# Patient Record
Sex: Female | Born: 1985 | Race: White | Hispanic: No | Marital: Single | Smoking: Current every day smoker
Health system: Southern US, Community
[De-identification: ages and names within clinical notes are randomized; demographics above are authoritative.]

## PROBLEM LIST (undated history)

## (undated) DIAGNOSIS — N926 Irregular menstruation, unspecified: Secondary | ICD-10-CM

## (undated) HISTORY — PX: NO PAST SURGERIES: SHX2092

## (undated) HISTORY — DX: Irregular menstruation, unspecified: N92.6

---

## 2020-09-06 NOTE — L&D Delivery Note (Addendum)
Vaginal Delivery Note  Spontaneous delivery of live viable female infant from the LOA position through an intact perineum. Delivery of anterior right shoulder with gentle downward guidance followed by delivery of the left posterior shoulder with gentle upward guidance. Body followed spontaneously. Infant placed on maternal chest. Nursery present and helped with neonatal resuscitation and evaluation. Cord clamped and cut after one minute. Cord blood collected. Placenta delivered spontaneously and intact with a 3 vessel cord.  Left labial  laceration repaired with 3-0 vicryl. Uterus firm and below umbilicus at the end of the delivery.  Mom and baby recovering in stable condition. Sponge and needle counts were correct at the end of the delivery.  APGARS: 1 minute:8 5 minutes: 9 Weight: pending Epidural present Laceration: Left labial  Adelene Idler MD [facility], [facility] 05/02/21 4:08 AM

## 2020-12-23 ENCOUNTER — Ambulatory Visit (INDEPENDENT_AMBULATORY_CARE_PROVIDER_SITE_OTHER): Payer: Medicaid Other | Admitting: Advanced Practice Midwife

## 2020-12-23 ENCOUNTER — Encounter: Payer: Self-pay | Admitting: Advanced Practice Midwife

## 2020-12-23 ENCOUNTER — Other Ambulatory Visit (HOSPITAL_COMMUNITY)
Admission: RE | Admit: 2020-12-23 | Discharge: 2020-12-23 | Disposition: A | Discharge status: Home / Self Care | Payer: Medicaid Other | Source: Ambulatory Visit | Attending: Advanced Practice Midwife | Admitting: Advanced Practice Midwife

## 2020-12-23 ENCOUNTER — Other Ambulatory Visit: Payer: Self-pay

## 2020-12-23 VITALS — BP 122/74 | Ht 64.0 in | Wt 155.0 lb

## 2020-12-23 DIAGNOSIS — Z3481 Encounter for supervision of other normal pregnancy, first trimester: Secondary | ICD-10-CM | POA: Diagnosis present

## 2020-12-23 DIAGNOSIS — Z369 Encounter for antenatal screening, unspecified: Secondary | ICD-10-CM

## 2020-12-23 DIAGNOSIS — Z113 Encounter for screening for infections with a predominantly sexual mode of transmission: Secondary | ICD-10-CM

## 2020-12-23 DIAGNOSIS — Z3482 Encounter for supervision of other normal pregnancy, second trimester: Secondary | ICD-10-CM

## 2020-12-23 DIAGNOSIS — Z124 Encounter for screening for malignant neoplasm of cervix: Secondary | ICD-10-CM | POA: Diagnosis present

## 2020-12-23 DIAGNOSIS — Z3490 Encounter for supervision of normal pregnancy, unspecified, unspecified trimester: Secondary | ICD-10-CM | POA: Insufficient documentation

## 2020-12-23 DIAGNOSIS — Z3A20 20 weeks gestation of pregnancy: Secondary | ICD-10-CM

## 2020-12-23 NOTE — Progress Notes (Signed)
New Obstetric Patient H&P    Chief Complaint: "Desires prenatal care"   History of Present Illness: Patient is a 35 y.o. [id number] Not Hispanic or Latino female, presents with amenorrhea and positive home pregnancy test. Patient's last menstrual period was 07/30/2020. and based on her  LMP, her EDD is Estimated Date of Delivery: 05/06/21 and her EGA is [redacted]w[redacted]d. Cycles are 5 days, regular, and occur approximately every : 1-3 months. Her last pap smear was 3 years ago and was no abnormalities.    She had a urine pregnancy test which was positive 3 week(s)  ago. Her last menstrual period was normal and lasted for  5 day(s). Since her LMP she claims she has experienced breast tenderness, fatigue, nausea, vomiting, and positive fetal movement for the past 2 weeks. She denies vaginal bleeding. Her past medical history is noncontributory. Her prior pregnancies are notable for G1 2008 FT SVD female 7#12oz, G2 2017 FT SVD female 7#1oz  Since her LMP, she admits to the use of tobacco products  Yes she is cutting down and currently smoking 10 cpd She claims she has gained weight since the start of her pregnancy and does not know her pre-pregnant weight.  There are cats in the home in the home  no  She admits close contact with children on a regular basis  yes  She has had chicken pox in the past yes She has had Tuberculosis exposures, symptoms, or previously tested positive for TB   no Current or past history of domestic violence. no  Genetic Screening/Teratology Counseling: (Includes patient, baby's father, or anyone in either family with:)   1. Patient's age >/= 68 at [facility]  no 2. Thalassemia ([place], [place], Mediterranean, or Asian background): MCV<80  no 3. Neural tube defect (meningomyelocele, spina bifida, anencephaly)  no 4. Congenital heart defect  FOB brother with heart defect  5. Down syndrome  no 6. Tay-Sachs (Jewish, [place]  no 7. Canavan's Disease  no 8. Sickle cell disease or trait  (African)  no  9. Hemophilia or other blood disorders  no  10. Muscular dystrophy  no  11. Cystic fibrosis  no  12. Huntington's Chorea  no  13. Mental retardation/autism  no 14. Other inherited genetic or chromosomal disorder  no 15. Maternal metabolic disorder (DM, PKU, etc)  no 16. Patient or FOB with a child with a birth defect not listed above no  16a. Patient or FOB with a birth defect themselves no 17. Recurrent pregnancy loss, or stillbirth  no  18. Any medications since LMP other than prenatal vitamins (include vitamins, supplements, OTC meds, drugs, alcohol)  no 19. Any other genetic/environmental exposure to discuss  no  Infection History:   1. Lives with someone with TB or TB exposed  no  2. Patient or partner has history of genital herpes  no 3. Rash or viral illness since LMP  no 4. History of STI (GC, CT, HPV, syphilis, HIV)  no 5. History of recent travel :  no  Other pertinent information:  no    Review of Systems:10 point review of systems negative unless otherwise noted in HPI  Past Medical History:  Patient Active Problem List   Diagnosis Date Noted  . Supervision of normal pregnancy 12/23/2020    Clinic [facility] Prenatal Labs  Dating  Blood type:     Genetic Screen 1 Screen:    AFP:     Quad:     NIPS: Antibody:   Anatomic [place]  Rubella:    Varicella: @VZVIGG @  GTT Early: NA               Third trimester:  RPR:     Rhogam  HBsAg:     Vaccines TDAP:                       Flu Shot: Covid: HIV:     Baby Food Plans breast                               GBS:   GC/CT:  Contraception  Pap: ~3 years ago- [facility] normal per patient report. 12/23/2020:  CBB     CS/VBAC NA Late entry to care ~21 wks  Support Person [name] Irregular periods        Past Surgical History:  Past Surgical History:  Procedure Laterality Date  . NO PAST SURGERIES      Gynecologic History: Patient's last menstrual period was 07/30/2020.  Obstetric History:  08/01/2020  Family History:  History reviewed. No pertinent family history.  Social History:  Social History   Socioeconomic History  . Marital status: Single    Spouse name: Not on file  . Number of children: Not on file  . Years of education: Not on file  . Highest education level: Not on file  Occupational History  . Not on file  Tobacco Use  . Smoking status: Current Every Day Smoker  . Smokeless tobacco: Never Used  Substance and Sexual Activity  . Alcohol use: Never  . Drug use: Never  . Sexual activity: Yes    Birth control/protection: None  Other Topics Concern  . Not on file  Social History Narrative  . Not on file   Social Determinants of Health   Financial Resource Strain: Not on file  Food Insecurity: Not on file  Transportation Needs: Not on file  Physical Activity: Not on file  Stress: Not on file  Social Connections: Not on file  Intimate Partner Violence: Not on file    Allergies:  No Known Allergies  Medications: Prior to Admission medications   Not on File    Physical Exam Vitals: Blood pressure 122/74, height 5\' 4"  (1.626 m), weight 155 lb (70.3 kg), last menstrual period 07/30/2020.  General: NAD HEENT: normocephalic, anicteric Thyroid: no enlargement, no palpable nodules Pulmonary: No increased work of breathing, CTAB Cardiovascular: RRR, distal pulses 2+ Abdomen: NABS, soft, non-tender, non-distended.  Umbilicus without lesions.  No hepatomegaly, splenomegaly or masses palpable. No evidence of hernia. FHTs 130s/140s. Fundal Height 21 cm Genitourinary:  External: Normal external female genitalia.  Normal urethral meatus, normal Bartholin's and Skene's glands.    Vagina: Normal vaginal mucosa, no evidence of prolapse.    Cervix: Grossly normal in appearance, no bleeding, no CMT  Uterus: Enlarged, mobile, normal contour.    Adnexa: ovaries non-enlarged, no adnexal masses  Rectal: deferred Extremities: no edema, erythema, or  tenderness Neurologic: Grossly intact Psychiatric: mood appropriate, affect full   The following were addressed during this visit:  Breastfeeding Education - Early initiation of breastfeeding    Comments: Keeps milk supply adequate, helps contract uterus and slow bleeding, and early milk is the perfect first food and is easy to digest.   - The importance of exclusive breastfeeding    Comments: Provides antibodies, Lower risk of breast and ovarian cancers, and type-2 diabetes,Helps your body recover, Reduced chance of SIDS.   -  Risks of giving your baby anything other than breast milk if you are breastfeeding    Comments: Make the baby less content with breastfeeds, may make my baby more susceptible to illness, and may reduce my milk supply.   - The importance of early skin-to-skin contact    Comments: Keeps baby warm and secure, helps keep baby's blood sugar up and breathing steady, easier to bond and breastfeed, and helps calm baby.  - Rooming-in on a 24-hour basis    Comments: Easier to learn baby's feeding cues, easier to bond and get to know each other, and encourages milk production.   - Feeding on demand or baby-led feeding    Comments: Helps prevent breastfeeding complications, helps bring in good milk supply, prevents under or overfeeding, and helps baby feel content and satisfied   - Frequent feeding to help assure optimal milk production    Comments: Making a full supply of milk requires frequent removal of milk from breasts, infant will eat 8-12 times in 24 hours, if separated from infant use breast massage, hand expression and/ or pumping to remove milk from breasts.   - Effective positioning and attachment    Comments: Helps my baby to get enough breast milk, helps to produce an adequate milk supply, and helps prevent nipple pain and damage   - Exclusive breastfeeding for the first 6 months    Comments: Builds a healthy milk supply and keeps it up, protects baby  from sickness and disease, and breastmilk has everything your baby needs for the first 6 months.  - Individualized Education    Comments: Contraindications to breastfeeding and other special medical conditions Has limited experience breastfeeding second baby    Assessment: 57 y.o. G3P2002 at [redacted]w[redacted]d presenting to initiate prenatal care  Plan: 1) Avoid alcoholic beverages. 2) Patient encouraged not to smoke.  3) Discontinue the use of all non-medicinal drugs and chemicals.  4) Take prenatal vitamins daily.  5) Nutrition, food safety (fish, cheese advisories, and high nitrite foods) and exercise discussed. 6) Hospital and practice style discussed with cross coverage system.  7) Genetic Screening, such as with 1st Trimester Screening, cell free fetal DNA, AFP testing, and Ultrasound, as well as with amniocentesis and CVS as appropriate, is discussed with patient. At the conclusion of today's visit patient requested cell free DNA genetic testing 8) Patient is asked about travel to areas at risk for the Zika virus, and counseled to avoid travel and exposure to mosquitoes or sexual partners who may have themselves been exposed to the virus. Testing is discussed, and will be ordered as appropriate.  9) PAPtima, urine culture today 10) Anatomy scan/dating asap 11) Return to clinic in 4 weeks for ROB 12) NOB panel/MaterniT 21 after insurance established   Tresea Mall, CNM [facility] 12/23/2020, 4:01 PM

## 2020-12-23 NOTE — Progress Notes (Signed)
NOB today. LMP 07/30/2020, periods were not regular.

## 2020-12-23 NOTE — Patient Instructions (Signed)

## 2020-12-26 LAB — URINE CULTURE

## 2020-12-30 ENCOUNTER — Other Ambulatory Visit: Payer: Self-pay

## 2020-12-30 ENCOUNTER — Encounter: Payer: Self-pay | Admitting: Obstetrics and Gynecology

## 2020-12-30 ENCOUNTER — Ambulatory Visit (INDEPENDENT_AMBULATORY_CARE_PROVIDER_SITE_OTHER): Payer: Medicaid Other | Admitting: Obstetrics and Gynecology

## 2020-12-30 VITALS — BP 118/76 | Wt 154.0 lb

## 2020-12-30 DIAGNOSIS — Z3482 Encounter for supervision of other normal pregnancy, second trimester: Secondary | ICD-10-CM

## 2020-12-30 DIAGNOSIS — Z3A22 22 weeks gestation of pregnancy: Secondary | ICD-10-CM | POA: Diagnosis not present

## 2020-12-30 NOTE — Progress Notes (Signed)
Routine Prenatal Care Visit  Subjective  Debra Brennan is a 35 y.o. G3P2002 at [redacted]w[redacted]d being seen today for ongoing prenatal care.  She is currently monitored for the following issues for this low-risk pregnancy and has Supervision of normal pregnancy on their problem list.  ----------------------------------------------------------------------------------- Patient reports no complaints.    . Vag. Bleeding: None.  Movement: Present. Leaking Fluid denies.  Dating u/s sets new EDD due to uncertain LMP.  Dates were close, but she was quite uncertain.  See report in NOTES for ultrasound details. Images scanned into system on paper.  ----------------------------------------------------------------------------------- The following portions of the patient's history were reviewed and updated as appropriate: allergies, current medications, past family history, past medical history, past social history, past surgical history and problem list. Problem list updated.  Objective  Blood pressure 118/76, weight 154 lb (69.9 kg), last menstrual period 07/30/2020. Pregravid weight 155 lb (70.3 kg) Total Weight Gain -1 lb (-0.454 kg) Urinalysis: Urine Protein    Urine Glucose    Fetal Status: Fetal Heart Rate (bpm): 142 ([place])   Movement: Present     General:  Alert, oriented and cooperative. Patient is in no acute distress.  Skin: Skin is warm and dry. No rash noted.   Cardiovascular: Normal heart rate noted  Respiratory: Normal respiratory effort, no problems with respiration noted  Abdomen: Soft, gravid, appropriate for gestational age.       Pelvic:  Cervical exam deferred        Extremities: Normal range of motion.  Edema: None  Mental Status: Normal mood and affect. Normal behavior. Normal judgment and thought content.   Assessment   35 y.o. [id number] at [redacted]w[redacted]d by  05/01/2021, by Ultrasound presenting for routine prenatal visit  Plan   pregnancy Problems (from 12/23/20 to present)    Problem Noted  Resolved   Supervision of normal pregnancy 12/23/2020 by Tresea Mall, CNM No   Overview Signed 12/23/2020  3:58 PM by Tresea Mall, CNM    Clinic [facility] Prenatal Labs  Dating  Blood type:     Genetic Screen 1 Screen:    AFP:     Quad:     NIPS: Antibody:   Anatomic [place]  Rubella:    Varicella: @VZVIGG @  GTT Early: NA               Third trimester:  RPR:     Rhogam  HBsAg:     Vaccines TDAP:                       Flu Shot: Covid: HIV:     Baby Food Plans breast                               GBS:   GC/CT:  Contraception  Pap: ~3 years ago- [facility] normal per patient report. 12/23/2020:  CBB     CS/VBAC NA Late entry to care ~21 wks  Support Person [name] Irregular periods             Preterm labor symptoms and general obstetric precautions including but not limited to vaginal bleeding, contractions, leaking of fluid and fetal movement were reviewed in detail with the patient. Please refer to After Visit Summary for other counseling recommendations.   - Keep anatomy u/s on 01/13/21.   Return in about 4 weeks (around 01/27/2021) for Routine Prenatal Appointment.   01/29/2021, MD, [facility], [facility] 12/30/2020 8:41 PM

## 2020-12-31 LAB — CYTOLOGY - PAP
Chlamydia: NEGATIVE
Comment: NEGATIVE
Comment: NEGATIVE
Comment: NEGATIVE
Comment: NEGATIVE
Comment: NORMAL
Diagnosis: NEGATIVE
HPV 16: NEGATIVE
HPV 18 / 45: POSITIVE — AB
High risk HPV: POSITIVE — AB
Neisseria Gonorrhea: NEGATIVE
Trichomonas: NEGATIVE

## 2021-01-01 ENCOUNTER — Encounter: Payer: Self-pay | Admitting: Obstetrics and Gynecology

## 2021-01-01 NOTE — Progress Notes (Signed)
   ULTRASOUND REPORT  Location: [facility] Date of Service: 12/30/2020   Indications:Dating of pregnancy with uncertain LMP Findings:  Mason Jim intrauterine pregnancy is visualized with FHR at 142 BPM.   Biometrics give an (U/S) Gestational age of [redacted]w[redacted]d and an (U/S) EDD of 05/01/2021.  Fetal presentation is Variable.  Placenta: posterior. Grade: 0 AFI: subjectively normal  EFW: 1 lb 3 oz (533 grams)  Impression:  [redacted]w[redacted]d Viable Singleton Intrauterine pregnancy with EDD based on today's ultrasound  I personally performed and interpreted the transabdominal ultrasound.    [name], MD, [facility], [facility] 01/01/2021 8:46 PM

## 2021-01-02 ENCOUNTER — Telehealth: Payer: Self-pay

## 2021-01-02 NOTE — Telephone Encounter (Signed)
Per [facility] patient needs a colposcopy with Dr. Jean Rosenthal. Patient was already scheduled with [facility] for 01/14/21 I just which to [facility] with Colposcopy. I call and left generic message for patient to call back to confirm scheduled appointment

## 2021-01-13 ENCOUNTER — Ambulatory Visit
Admission: RE | Admit: 2021-01-13 | Discharge: 2021-01-13 | Disposition: A | Discharge status: Home / Self Care | Payer: Medicaid Other | Source: Ambulatory Visit | Attending: Advanced Practice Midwife | Admitting: Advanced Practice Midwife

## 2021-01-13 ENCOUNTER — Other Ambulatory Visit: Payer: Self-pay

## 2021-01-13 DIAGNOSIS — Z369 Encounter for antenatal screening, unspecified: Secondary | ICD-10-CM | POA: Insufficient documentation

## 2021-01-13 DIAGNOSIS — Z3482 Encounter for supervision of other normal pregnancy, second trimester: Secondary | ICD-10-CM | POA: Diagnosis present

## 2021-01-14 ENCOUNTER — Ambulatory Visit (INDEPENDENT_AMBULATORY_CARE_PROVIDER_SITE_OTHER): Payer: Medicaid Other | Admitting: Obstetrics and Gynecology

## 2021-01-14 ENCOUNTER — Encounter: Payer: Medicaid Other | Admitting: Obstetrics and Gynecology

## 2021-01-14 ENCOUNTER — Encounter: Payer: Self-pay | Admitting: Obstetrics and Gynecology

## 2021-01-14 VITALS — BP 122/70 | Ht 63.0 in | Wt 155.0 lb

## 2021-01-14 DIAGNOSIS — Z3482 Encounter for supervision of other normal pregnancy, second trimester: Secondary | ICD-10-CM

## 2021-01-14 DIAGNOSIS — O282 Abnormal cytological finding on antenatal screening of mother: Secondary | ICD-10-CM

## 2021-01-14 DIAGNOSIS — R87618 Other abnormal cytological findings on specimens from cervix uteri: Secondary | ICD-10-CM | POA: Diagnosis not present

## 2021-01-14 DIAGNOSIS — Z131 Encounter for screening for diabetes mellitus: Secondary | ICD-10-CM

## 2021-01-14 DIAGNOSIS — Z3A24 24 weeks gestation of pregnancy: Secondary | ICD-10-CM

## 2021-01-14 DIAGNOSIS — Z113 Encounter for screening for infections with a predominantly sexual mode of transmission: Secondary | ICD-10-CM

## 2021-01-14 NOTE — Progress Notes (Signed)
HPI:  Debra Brennan is a 35 y.o.  [id number]  who presents today for evaluation and management of abnormal cervical cytology.    Dysplasia History:  2022, NILM HPV 18+   OB History  Gravida Para Term Preterm AB Living  3 2 2     2   SAB IAB Ectopic Multiple Live Births          2    # Outcome Date GA Lbr Len/2nd Weight Sex Delivery Anes PTL Lv  3 Current           2 Term 2017   7 lb 1 oz (3.204 kg) M Vag-Spont   LIV  1 Term 2008   7 lb 12 oz (3.515 kg) F Vag-Spont   LIV    Past Medical History:  Diagnosis Date  . Abnormal menstrual periods     Past Surgical History:  Procedure Laterality Date  . NO PAST SURGERIES      SOCIAL HISTORY: Social History   Substance and Sexual Activity  Alcohol Use Never   Social History   Substance and Sexual Activity  Drug Use Never     History reviewed. No pertinent family history.  ALLERGIES:  Patient has no known allergies.  No current outpatient medications on file prior to visit.   No current facility-administered medications on file prior to visit.    Physical Exam: -Vitals:  BP 122/70   Ht 5\' 3"  (1.6 m)   Wt 155 lb (70.3 kg)   LMP 07/30/2020 (Approximate)   BMI 27.46 kg/m  GEN: WD, WN, NAD.  A+ O x 3, good mood and affect. ABD:  NT, ND.  Soft, no masses.  No hernias noted.   Pelvic:   Vulva: Normal appearance.  No lesions.  Vagina: No lesions or abnormalities noted.  Support: Normal pelvic support.  Urethra No masses tenderness or scarring.  Meatus Normal size without lesions or prolapse.  Cervix: See below.  Anus: Normal exam.  No lesions.  Perineum: Normal exam.  No lesions.        Bimanual   Uterus: Normal size.  Non-tender.  Mobile.  AV.  Adnexae: No masses.  Non-tender to palpation.  Cul-de-sac: Negative for abnormality.   PROCEDURE: 1.  Urine Pregnancy Test:  positive 2.  Colposcopy performed with 4% acetic acid after verbal consent obtained                                         -Aceto-white  Lesions Location(s):  Mild, diffuse              -Biopsy performed: no              -ECC indicated and performed: No.     -Biopsy sites made hemostatic with pressure, AgNO3, and/or Monsel's solution   -Satisfactory colposcopy: Yes.      -Evidence of Invasive cervical CA :  NO  ASSESSMENT:  Debra Brennan is a 35 y.o. Debra Brennan here for  1. Encounter for supervision of other normal pregnancy in second trimester   2. [redacted] weeks gestation of pregnancy   3. Screen for STD (sexually transmitted disease)   4. Pap smear abnormality of cervix/human papillomavirus (HPV) positive   5. Screening for diabetes mellitus   .  PLAN: 1.  I discussed the grading system of pap smears and HPV high risk viral types.  We will discuss  and base management after colpo results return. 2. Follow up postpartum for definitive diagnosis.       Debra Mohair, MD  [facility], [facility] 01/14/2021  10:50 AM

## 2021-01-14 NOTE — Progress Notes (Signed)
  Routine Prenatal Care Visit  Subjective  Debra Brennan is a 35 y.o. G3P2002 at [redacted]w[redacted]d being seen today for ongoing prenatal care.  She is currently monitored for the following issues for this low-risk pregnancy and has Supervision of normal pregnancy on their problem list.  ----------------------------------------------------------------------------------- Patient reports no complaints.    . Vag. Bleeding: None.  Movement: Present. Leaking Fluid denies.  ----------------------------------------------------------------------------------- The following portions of the patient's history were reviewed and updated as appropriate: allergies, current medications, past family history, past medical history, past social history, past surgical history and problem list. Problem list updated.  Objective  Blood pressure 122/70, height 5\' 3"  (1.6 m), weight 155 lb (70.3 kg), last menstrual period 07/30/2020. Pregravid weight 155 lb (70.3 kg) Total Weight Gain 0 lb (0 kg) Urinalysis: Urine Protein    Urine Glucose    Fetal Status: Fetal Heart Rate (bpm): 138 Fundal Height: 24 cm Movement: Present     General:  Alert, oriented and cooperative. Patient is in no acute distress.  Skin: Skin is warm and dry. No rash noted.   Cardiovascular: Normal heart rate noted  Respiratory: Normal respiratory effort, no problems with respiration noted  Abdomen: Soft, gravid, appropriate for gestational age.       Pelvic:  Cervical exam deferred        Extremities: Normal range of motion.  Edema: None  Mental Status: Normal mood and affect. Normal behavior. Normal judgment and thought content.   Assessment   35 y.o. 34 at [redacted]w[redacted]d by  05/01/2021, by Ultrasound presenting for routine prenatal visit  Plan   pregnancy Problems (from 12/23/20 to present)    Problem Noted Resolved   Supervision of normal pregnancy 12/23/2020 by 12/25/2020, CNM No   Overview Signed 12/23/2020  3:58 PM by 12/25/2020, CNM     [facility]inic [facility] Prenatal Labs  Dating  Blood type:     Genetic Screen 1 Screen:    AFP:     Quad:     NIPS: Antibody:   Anatomic Tresea Mall  Rubella:    Varicella: @VZVIGG @  GTT Early: NA               Third trimester:  RPR:     Rhogam  HBsAg:     Vaccines TDAP:                       Flu Shot: Covid: HIV:     Baby Food Plans breast                               GBS:   GC/CT:  Contraception  Pap: ~3 years ago- [facility] normal per patient report. 12/23/2020:  CBB     CS/VBAC NA Late entry to care ~21 wks  Support Person [name] Irregular periods             Preterm labor symptoms and general obstetric precautions including but not limited to vaginal bleeding, contractions, leaking of fluid and fetal movement were reviewed in detail with the patient. Please refer to After Visit Summary for other counseling recommendations.   Anatomy scan yesterday. No report available at this time. Will follow up on report. colpo today. No bx taken. Repeat pp.   Return in about 4 weeks (around 02/11/2021) for 28 week labs/rhogam with ROB.   12/25/2020, MD, 04/13/2021 OB/GYN, [facility] 01/14/2021 10:50 AM

## 2021-01-19 LAB — RPR+RH+ABO+RUB AB+AB SCR+CB...
Antibody Screen: NEGATIVE
HIV Screen 4th Generation wRfx: NONREACTIVE
Hematocrit: 34.5 % (ref 34.0–46.6)
Hemoglobin: 11.8 g/dL (ref 11.1–15.9)
Hepatitis B Surface Ag: NEGATIVE
MCH: 33.1 pg — ABNORMAL HIGH (ref 26.6–33.0)
MCHC: 34.2 g/dL (ref 31.5–35.7)
MCV: 97 fL (ref 79–97)
Platelets: 284 10*3/uL (ref 150–450)
RBC: 3.56 x10E6/uL — ABNORMAL LOW (ref 3.77–5.28)
RDW: 11.9 % (ref 11.7–15.4)
RPR Ser Ql: NONREACTIVE
Rh Factor: NEGATIVE
Rubella Antibodies, IGG: <0.9 index — ABNORMAL LOW (ref >0.99)
Varicella zoster IgG: 201 index (ref >165)
WBC: 12 10*3/uL — ABNORMAL HIGH (ref 3.4–10.8)

## 2021-01-20 ENCOUNTER — Encounter: Payer: Medicaid Other | Admitting: Obstetrics

## 2021-01-27 ENCOUNTER — Encounter: Payer: Medicaid Other | Admitting: Obstetrics and Gynecology

## 2021-01-29 ENCOUNTER — Other Ambulatory Visit: Payer: Self-pay

## 2021-01-29 ENCOUNTER — Ambulatory Visit (INDEPENDENT_AMBULATORY_CARE_PROVIDER_SITE_OTHER): Payer: Medicaid Other | Admitting: Obstetrics

## 2021-01-29 VITALS — BP 114/70 | Ht 63.0 in | Wt 159.4 lb

## 2021-01-29 DIAGNOSIS — Z3A26 26 weeks gestation of pregnancy: Secondary | ICD-10-CM

## 2021-01-29 DIAGNOSIS — Z3482 Encounter for supervision of other normal pregnancy, second trimester: Secondary | ICD-10-CM

## 2021-01-29 NOTE — Progress Notes (Signed)
  Routine Prenatal Care Visit  Subjective  Debra Brennan is a 35 y.o. G3P2002 at [redacted]w[redacted]d being seen today for ongoing prenatal care.  She is currently monitored for the following issues for this low-risk pregnancy and has Supervision of normal pregnancy on their problem list.  ----------------------------------------------------------------------------------- Patient reports no complaints.   Contractions: Not present. Vag. Bleeding: None.  Movement: Present. Leaking Fluid denies.  ----------------------------------------------------------------------------------- The following portions of the patient's history were reviewed and updated as appropriate: allergies, current medications, past family history, past medical history, past social history, past surgical history and problem list. Problem list updated.  Objective  Blood pressure 114/70, height 5\' 3"  (1.6 m), weight 159 lb 6.4 oz (72.3 kg), last menstrual period 07/30/2020. Pregravid weight 155 lb (70.3 kg) Total Weight Gain 4 lb 6.4 oz (1.996 kg) Urinalysis: Urine Protein    Urine Glucose    Fetal Status:     Movement: Present     General:  Alert, oriented and cooperative. Patient is in no acute distress.  Skin: Skin is warm and dry. No rash noted.   Cardiovascular: Normal heart rate noted  Respiratory: Normal respiratory effort, no problems with respiration noted  Abdomen: Soft, gravid, appropriate for gestational age. Pain/Pressure: Absent     Pelvic:  Cervical exam deferred        Extremities: Normal range of motion.     Mental Status: Normal mood and affect. Normal behavior. Normal judgment and thought content.   Assessment   35 y.o. 34 at [redacted]w[redacted]d by  05/01/2021, by Ultrasound presenting for routine prenatal visit  Plan   pregnancy Problems (from 12/23/20 to present)    Problem Noted Resolved   Supervision of normal pregnancy 12/23/2020 by 12/25/2020, CNM No   Overview Signed 12/23/2020  3:58 PM by 12/25/2020, CNM     Clinic [facility] Prenatal Labs  Dating  Blood type:     Genetic Screen 1 Screen:    AFP:     Quad:     NIPS: Antibody:   Anatomic Tresea Mall  Rubella:    Varicella: @VZVIGG @  GTT Early: NA               Third trimester:  RPR:     Rhogam  HBsAg:     Vaccines TDAP:                       Flu Shot: Covid: HIV:     Baby Food Plans breast                               GBS:   GC/CT:  Contraception  Pap: ~3 years ago- [facility] normal per patient report. 12/23/2020:  CBB     CS/VBAC NA Late entry to care ~21 wks  Support Person [name] Irregular periods             Preterm labor symptoms and general obstetric precautions including but not limited to vaginal bleeding, contractions, leaking of fluid and fetal movement were reviewed in detail with the patient. Please refer to After Visit Summary for other counseling recommendations.  She plans an epidural. Has had 2 episiotomies-discussed our commitment to intact perineum or small laceration :)  Return in about 2 weeks (around 02/12/2021) for return OB, 28 wk labs.  12/25/2020, CNM  01/29/2021 4:06 PM

## 2021-02-10 ENCOUNTER — Other Ambulatory Visit: Payer: Self-pay

## 2021-02-10 ENCOUNTER — Other Ambulatory Visit: Payer: Medicaid Other

## 2021-02-10 ENCOUNTER — Ambulatory Visit (INDEPENDENT_AMBULATORY_CARE_PROVIDER_SITE_OTHER): Payer: Medicaid Other | Admitting: Obstetrics and Gynecology

## 2021-02-10 VITALS — BP 116/72 | Ht 63.0 in | Wt 164.0 lb

## 2021-02-10 DIAGNOSIS — Z3A28 28 weeks gestation of pregnancy: Secondary | ICD-10-CM

## 2021-02-10 DIAGNOSIS — Z3483 Encounter for supervision of other normal pregnancy, third trimester: Secondary | ICD-10-CM

## 2021-02-10 DIAGNOSIS — Z3482 Encounter for supervision of other normal pregnancy, second trimester: Secondary | ICD-10-CM

## 2021-02-10 DIAGNOSIS — O26892 Other specified pregnancy related conditions, second trimester: Secondary | ICD-10-CM

## 2021-02-10 DIAGNOSIS — O4440 Low lying placenta NOS or without hemorrhage, unspecified trimester: Secondary | ICD-10-CM

## 2021-02-10 DIAGNOSIS — Z6791 Unspecified blood type, Rh negative: Secondary | ICD-10-CM | POA: Diagnosis not present

## 2021-02-10 DIAGNOSIS — Z131 Encounter for screening for diabetes mellitus: Secondary | ICD-10-CM

## 2021-02-10 DIAGNOSIS — Z113 Encounter for screening for infections with a predominantly sexual mode of transmission: Secondary | ICD-10-CM

## 2021-02-10 LAB — POCT URINALYSIS DIPSTICK OB
Glucose, UA: NEGATIVE
POC,PROTEIN,UA: NEGATIVE

## 2021-02-10 MED ORDER — RHO D IMMUNE GLOBULIN 1500 UNIT/2ML IJ SOSY
300.0000 ug | PREFILLED_SYRINGE | Freq: Once | INTRAMUSCULAR | Status: AC
  Administered 2021-02-10: 300 ug via INTRAMUSCULAR

## 2021-02-10 NOTE — Progress Notes (Signed)
    Routine Prenatal Care Visit  Subjective  Debra Brennan is a 35 y.o. G3P2002 at [redacted]w[redacted]d being seen today for ongoing prenatal care.  She is currently monitored for the following issues for this low-risk pregnancy and has Supervision of normal pregnancy on their problem list.  ----------------------------------------------------------------------------------- Patient reports no complaints.   Contractions: Not present. Vag. Bleeding: None.  Movement: Present. Denies leaking of fluid.  ----------------------------------------------------------------------------------- The following portions of the patient's history were reviewed and updated as appropriate: allergies, current medications, past family history, past medical history, past social history, past surgical history and problem list. Problem list updated.   Objective  Blood pressure 116/72, height 5\' 3"  (1.6 m), weight 164 lb (74.4 kg), last menstrual period 07/30/2020. Pregravid weight 155 lb (70.3 kg) Total Weight Gain 9 lb (4.082 kg) Urinalysis:      Fetal Status: Fetal Heart Rate (bpm): 135 Fundal Height: 28 cm Movement: Present     General:  Alert, oriented and cooperative. Patient is in no acute distress.  Skin: Skin is warm and dry. No rash noted.   Cardiovascular: Normal heart rate noted  Respiratory: Normal respiratory effort, no problems with respiration noted  Abdomen: Soft, gravid, appropriate for gestational age. Pain/Pressure: Absent     Pelvic:  Cervical exam deferred        Extremities: Normal range of motion.  Edema: None  Mental Status: Normal mood and affect. Normal behavior. Normal judgment and thought content.     Assessment   35 y.o. 34 at [redacted]w[redacted]d by  05/01/2021, by Ultrasound presenting for routine prenatal visit  Plan   pregnancy Problems (from 12/23/20 to present)    Problem Noted Resolved   Supervision of normal pregnancy 12/23/2020 by 12/25/2020, CNM No   Overview Addendum 02/10/2021  9:32 AM  by 04/12/2021, MD     Nursing Staff Provider  Office Location  [facility] Dating    Language  English Anatomy Natale Milch   complete Low lying placenta Fetal pelviectasis  Flu Vaccine   Genetic Screen  none  TDaP vaccine    Hgb A1C or  GTT Third trimester :   Covid unvaccinated   LAB RESULTS   Rhogam   02/10/2021 Blood Type O/Negative/-- (05/11 1205)   Feeding Plan  breast Antibody Negative (05/11 1205)  Contraception  Rubella <0.90 (05/11 1205) NI  Circumcision  RPR Non Reactive (05/11 1205)   Pediatrician   HBsAg Negative (05/11 1205)   Support Person [name] HIV Non Reactive (05/11 1205)  Prenatal Classes  Discussed Varicella  immune    GBS  (For PCN allergy, check sensitivities)   BTL Consent     VBAC Consent  Pap  2022 NIL, HR HPV +     Hgb Electro      CF      SMA                Previous Version      Rhogam today Discussed prenatal classes Discussed birth control options 1GTT today 2023 follow up ordered for placenta and fetal kidney  Gestational age appropriate obstetric precautions including but not limited to vaginal bleeding, contractions, leaking of fluid and fetal movement were reviewed in detail with the patient.    Return in about 2 weeks (around 02/24/2021) for ROB in person.  02/26/2021 MD [facility], [facility] 02/10/2021, 9:32 AM

## 2021-02-10 NOTE — Patient Instructions (Signed)

## 2021-02-11 LAB — 28 WEEKS RH-PANEL
Antibody Screen: NEGATIVE
Basophils Absolute: 0.1 10*3/uL (ref 0.0–0.2)
Basos: 1 %
EOS (ABSOLUTE): 0.1 10*3/uL (ref 0.0–0.4)
Eos: 1 %
Gestational Diabetes Screen: 98 mg/dL (ref 65–139)
HIV Screen 4th Generation wRfx: NONREACTIVE
Hematocrit: 33.5 % — ABNORMAL LOW (ref 34.0–46.6)
Hemoglobin: 11.1 g/dL (ref 11.1–15.9)
Immature Grans (Abs): 0.3 10*3/uL — ABNORMAL HIGH (ref 0.0–0.1)
Immature Granulocytes: 2 %
Lymphocytes Absolute: 1.9 10*3/uL (ref 0.7–3.1)
Lymphs: 13 %
MCH: 32.5 pg (ref 26.6–33.0)
MCHC: 33.1 g/dL (ref 31.5–35.7)
MCV: 98 fL — ABNORMAL HIGH (ref 79–97)
Monocytes Absolute: 0.8 10*3/uL (ref 0.1–0.9)
Monocytes: 6 %
Neutrophils Absolute: 10.8 10*3/uL — ABNORMAL HIGH (ref 1.4–7.0)
Neutrophils: 77 %
Platelets: 269 10*3/uL (ref 150–450)
RBC: 3.42 x10E6/uL — ABNORMAL LOW (ref 3.77–5.28)
RDW: 12.3 % (ref 11.7–15.4)
RPR Ser Ql: NONREACTIVE
WBC: 14 10*3/uL — ABNORMAL HIGH (ref 3.4–10.8)

## 2021-02-24 ENCOUNTER — Other Ambulatory Visit: Payer: Self-pay

## 2021-02-24 ENCOUNTER — Encounter: Payer: Self-pay | Admitting: Obstetrics and Gynecology

## 2021-02-24 ENCOUNTER — Ambulatory Visit (INDEPENDENT_AMBULATORY_CARE_PROVIDER_SITE_OTHER): Payer: Medicaid Other | Admitting: Obstetrics and Gynecology

## 2021-02-24 VITALS — BP 118/70 | Ht 63.0 in | Wt 166.6 lb

## 2021-02-24 DIAGNOSIS — Z23 Encounter for immunization: Secondary | ICD-10-CM

## 2021-02-24 DIAGNOSIS — Z3A30 30 weeks gestation of pregnancy: Secondary | ICD-10-CM

## 2021-02-24 DIAGNOSIS — Z3483 Encounter for supervision of other normal pregnancy, third trimester: Secondary | ICD-10-CM

## 2021-02-24 NOTE — Progress Notes (Signed)
    Routine Prenatal Care Visit  Subjective  Debra Brennan is a 35 y.o. G3P2002 at [redacted]w[redacted]d being seen today for ongoing prenatal care.  She is currently monitored for the following issues for this low-risk pregnancy and has Supervision of normal pregnancy on their problem list.  ----------------------------------------------------------------------------------- Patient reports no complaints.   Contractions: Not present. Vag. Bleeding: None.  Movement: Present. Denies leaking of fluid.  ----------------------------------------------------------------------------------- The following portions of the patient's history were reviewed and updated as appropriate: allergies, current medications, past family history, past medical history, past social history, past surgical history and problem list. Problem list updated.   Objective  Blood pressure 118/70, height 5\' 3"  (1.6 m), weight 166 lb 9.6 oz (75.6 kg), last menstrual period 07/30/2020. Pregravid weight 155 lb (70.3 kg) Total Weight Gain 11 lb 9.6 oz (5.262 kg) Urinalysis:      Fetal Status: Fetal Heart Rate (bpm): 145 Fundal Height: 30 cm Movement: Present     General:  Alert, oriented and cooperative. Patient is in no acute distress.  Skin: Skin is warm and dry. No rash noted.   Cardiovascular: Normal heart rate noted  Respiratory: Normal respiratory effort, no problems with respiration noted  Abdomen: Soft, gravid, appropriate for gestational age. Pain/Pressure: Absent     Pelvic:  Cervical exam deferred        Extremities: Normal range of motion.  Edema: None  Mental Status: Normal mood and affect. Normal behavior. Normal judgment and thought content.     Assessment   35 y.o. 34 at [redacted]w[redacted]d by  05/01/2021, by Ultrasound presenting for routine prenatal visit  Plan   pregnancy Problems (from 12/23/20 to present)     Problem Noted Resolved   Supervision of normal pregnancy 12/23/2020 by 12/25/2020, CNM No   Overview Addendum  02/24/2021 11:13 AM by 02/26/2021, MD     Nursing Staff Provider  Office Location  [facility] Dating  22 wk Natale Milch  Language  English Anatomy US   complete Low lying placenta Fetal pelviectasis  Flu Vaccine   Genetic Screen  none  TDaP vaccine   02/24/2021 Hgb A1C or  GTT Third trimester : 98  Covid unvaccinated   LAB RESULTS   Rhogam   02/10/2021 Blood Type O/Negative/-- (05/11 1205)   Feeding Plan  breast Antibody Negative (05/11 1205)  Contraception  Rubella <0.90 (05/11 1205) NI  Circumcision  RPR Non Reactive (05/11 1205)   Pediatrician   HBsAg Negative (05/11 1205)   Support Person [name] HIV Non Reactive (05/11 1205)  Prenatal Classes  Discussed Varicella  immune    GBS  (For PCN allergy, check sensitivities)   BTL Consent     VBAC Consent  Pap  2022 NIL, HR HPV +     Hgb Electro      CF      SMA                     Gestational age appropriate obstetric precautions including but not limited to vaginal bleeding, contractions, leaking of fluid and fetal movement were reviewed in detail with the patient.    Return in about 2 weeks (around 03/10/2021) for ROB in person.  05/11/2021 MD [facility], [facility] 02/24/2021, 11:13 AM

## 2021-02-24 NOTE — Patient Instructions (Signed)

## 2021-02-24 NOTE — Addendum Note (Signed)
Addended by: Clement Husbands A on: 02/24/2021 11:50 AM   Modules accepted: Orders

## 2021-03-10 ENCOUNTER — Other Ambulatory Visit: Payer: Self-pay

## 2021-03-10 ENCOUNTER — Ambulatory Visit (INDEPENDENT_AMBULATORY_CARE_PROVIDER_SITE_OTHER): Payer: Medicaid Other | Admitting: Obstetrics

## 2021-03-10 VITALS — BP 110/70 | Wt 166.0 lb

## 2021-03-10 DIAGNOSIS — Z3A32 32 weeks gestation of pregnancy: Secondary | ICD-10-CM

## 2021-03-10 DIAGNOSIS — Z3483 Encounter for supervision of other normal pregnancy, third trimester: Secondary | ICD-10-CM

## 2021-03-10 LAB — POCT URINALYSIS DIPSTICK OB
Glucose, UA: NEGATIVE
POC,PROTEIN,UA: NEGATIVE

## 2021-03-10 NOTE — Progress Notes (Signed)
  Routine Prenatal Care Visit  Subjective  Debra Brennan is a 35 y.o. G3P2002 at [redacted]w[redacted]d being seen today for ongoing prenatal care.  She is currently monitored for the following issues for this low-risk pregnancy and has Supervision of normal pregnancy on their problem list.  ----------------------------------------------------------------------------------- Patient reports no complaints.   Contractions: Not present. Vag. Bleeding: None.  Movement: Present. Leaking Fluid denies.  ----------------------------------------------------------------------------------- The following portions of the patient's history were reviewed and updated as appropriate: allergies, current medications, past family history, past medical history, past social history, past surgical history and problem list. Problem list updated.  Objective  Blood pressure 110/70, weight 166 lb (75.3 kg), last menstrual period 07/30/2020. Pregravid weight 155 lb (70.3 kg) Total Weight Gain 11 lb (4.99 kg) Urinalysis: Urine Protein Negative  Urine Glucose Negative  Fetal Status:     Movement: Present     General:  Alert, oriented and cooperative. Patient is in no acute distress.  Skin: Skin is warm and dry. No rash noted.   Cardiovascular: Normal heart rate noted  Respiratory: Normal respiratory effort, no problems with respiration noted  Abdomen: Soft, gravid, appropriate for gestational age. Pain/Pressure: Absent     Pelvic:  Cervical exam deferred        Extremities: Normal range of motion.     Mental Status: Normal mood and affect. Normal behavior. Normal judgment and thought content.   Assessment   34 y.o. [id number] at [redacted]w[redacted]d by  05/01/2021, by Ultrasound presenting for routine prenatal visit  Plan   pregnancy Problems (from 12/23/20 to present)    Problem Noted Resolved   Supervision of normal pregnancy 12/23/2020 by Tresea Mall, CNM No   Overview Addendum 02/24/2021 11:13 AM by Natale Milch, MD     Nursing  Staff Provider  Office Location  [facility] Dating  22 wk [place]  Language  English Anatomy US   complete Low lying placenta Fetal pelviectasis  Flu Vaccine   Genetic Screen  none  TDaP vaccine   02/24/2021 Hgb A1C or  GTT Third trimester : 98  Covid unvaccinated   LAB RESULTS   Rhogam   02/10/2021 Blood Type O/Negative/-- (05/11 1205)   Feeding Plan  breast Antibody Negative (05/11 1205)  Contraception  Rubella <0.90 (05/11 1205) NI  Circumcision  RPR Non Reactive (05/11 1205)   Pediatrician   HBsAg Negative (05/11 1205)   Support Person [name] HIV Non Reactive (05/11 1205)  Prenatal Classes  Discussed Varicella  immune    GBS  (For PCN allergy, check sensitivities)   BTL Consent     VBAC Consent  Pap  2022 NIL, HR HPV +     Hgb Electro      CF      SMA                     Preterm labor symptoms and general obstetric precautions including but not limited to vaginal bleeding, contractions, leaking of fluid and fetal movement were reviewed in detail with the patient. Please refer to After Visit Summary for other counseling recommendations.  She plans on getting an epidural. Undecided re birth control. No follow-ups on file.  Mirna Mires, CNM  03/10/2021 10:29 AM

## 2021-03-19 ENCOUNTER — Other Ambulatory Visit: Payer: Self-pay

## 2021-03-19 ENCOUNTER — Ambulatory Visit
Admission: RE | Admit: 2021-03-19 | Discharge: 2021-03-19 | Disposition: A | Discharge status: Home / Self Care | Payer: Medicaid Other | Source: Ambulatory Visit | Attending: Obstetrics and Gynecology | Admitting: Obstetrics and Gynecology

## 2021-03-19 DIAGNOSIS — O4440 Low lying placenta NOS or without hemorrhage, unspecified trimester: Secondary | ICD-10-CM

## 2021-03-19 DIAGNOSIS — Z3483 Encounter for supervision of other normal pregnancy, third trimester: Secondary | ICD-10-CM

## 2021-03-24 ENCOUNTER — Ambulatory Visit (INDEPENDENT_AMBULATORY_CARE_PROVIDER_SITE_OTHER): Payer: Medicaid Other | Admitting: Obstetrics and Gynecology

## 2021-03-24 ENCOUNTER — Encounter: Payer: Self-pay | Admitting: Obstetrics and Gynecology

## 2021-03-24 ENCOUNTER — Other Ambulatory Visit: Payer: Self-pay

## 2021-03-24 VITALS — BP 120/80 | Wt 174.0 lb

## 2021-03-24 DIAGNOSIS — Z3A34 34 weeks gestation of pregnancy: Secondary | ICD-10-CM

## 2021-03-24 DIAGNOSIS — Z3483 Encounter for supervision of other normal pregnancy, third trimester: Secondary | ICD-10-CM

## 2021-03-24 NOTE — Progress Notes (Signed)
  Routine Prenatal Care Visit  Subjective  Debra Brennan is a 35 y.o. G3P2002 at [redacted]w[redacted]d being seen today for ongoing prenatal care.  She is currently monitored for the following issues for this low-risk pregnancy and has Supervision of normal pregnancy on their problem list.  ----------------------------------------------------------------------------------- Patient reports no complaints.   Contractions: Not present. Vag. Bleeding: None.  Movement: Present. Leaking Fluid denies.  ----------------------------------------------------------------------------------- The following portions of the patient's history were reviewed and updated as appropriate: allergies, current medications, past family history, past medical history, past social history, past surgical history and problem list. Problem list updated.  Objective  Blood pressure 120/80, weight 174 lb (78.9 kg), last menstrual period 07/30/2020. Pregravid weight 155 lb (70.3 kg) Total Weight Gain 19 lb (8.618 kg) Urinalysis: Urine Protein    Urine Glucose    Fetal Status: Fetal Heart Rate (bpm): 140 Fundal Height: 34 cm Movement: Present     General:  Alert, oriented and cooperative. Patient is in no acute distress.  Skin: Skin is warm and dry. No rash noted.   Cardiovascular: Normal heart rate noted  Respiratory: Normal respiratory effort, no problems with respiration noted  Abdomen: Soft, gravid, appropriate for gestational age. Pain/Pressure: Absent     Pelvic:  Cervical exam deferred        Extremities: Normal range of motion.  Edema: None  Mental Status: Normal mood and affect. Normal behavior. Normal judgment and thought content.   Assessment   36 y.o. [id number] at [redacted]w[redacted]d by  05/01/2021, by Ultrasound presenting for routine prenatal visit  Plan   pregnancy Problems (from 12/23/20 to present)     Problem Noted Resolved   Supervision of normal pregnancy 12/23/2020 by Tresea Mall, CNM No   Overview Addendum 02/24/2021 11:13 AM  by Natale Milch, MD     Nursing Staff Provider  Office Location  [facility] Dating  22 wk [place]  Language  English Anatomy US   complete Low lying placenta Fetal pelviectasis  Flu Vaccine   Genetic Screen  none  TDaP vaccine   02/24/2021 Hgb A1C or  GTT Third trimester : 98  Covid unvaccinated   LAB RESULTS   Rhogam   02/10/2021 Blood Type O/Negative/-- (05/11 1205)   Feeding Plan  breast Antibody Negative (05/11 1205)  Contraception  Rubella <0.90 (05/11 1205) NI  Circumcision  RPR Non Reactive (05/11 1205)   Pediatrician   HBsAg Negative (05/11 1205)   Support Person [name] HIV Non Reactive (05/11 1205)  Prenatal Classes  Discussed Varicella  immune    GBS  (For PCN allergy, check sensitivities)   BTL Consent     VBAC Consent  Pap  2022 NIL, HR HPV +     Hgb Electro      CF      SMA                    Preterm labor symptoms and general obstetric precautions including but not limited to vaginal bleeding, contractions, leaking of fluid and fetal movement were reviewed in detail with the patient. Please refer to After Visit Summary for other counseling recommendations.   Return in about 2 weeks (around 04/07/2021) for Routine Prenatal Appointment.   [name], MD, [facility], [facility] 03/24/2021 12:15 PM

## 2021-04-07 ENCOUNTER — Ambulatory Visit (INDEPENDENT_AMBULATORY_CARE_PROVIDER_SITE_OTHER): Payer: Medicaid Other | Admitting: Obstetrics & Gynecology

## 2021-04-07 ENCOUNTER — Encounter: Payer: Self-pay | Admitting: Obstetrics & Gynecology

## 2021-04-07 ENCOUNTER — Other Ambulatory Visit: Payer: Self-pay

## 2021-04-07 VITALS — BP 120/80 | Wt 174.0 lb

## 2021-04-07 DIAGNOSIS — Z3685 Encounter for antenatal screening for Streptococcus B: Secondary | ICD-10-CM

## 2021-04-07 DIAGNOSIS — Z3A36 36 weeks gestation of pregnancy: Secondary | ICD-10-CM

## 2021-04-07 DIAGNOSIS — Z3483 Encounter for supervision of other normal pregnancy, third trimester: Secondary | ICD-10-CM

## 2021-04-07 NOTE — Patient Instructions (Signed)

## 2021-04-07 NOTE — Progress Notes (Signed)
  Subjective  Fetal Movement? yes Contractions? no Leaking Fluid? no Vaginal Bleeding? no Mucus plug last week Objective  BP 120/80   Wt 174 lb (78.9 kg)   LMP 07/30/2020 (Approximate)   BMI 30.82 kg/m  General: NAD Pumonary: no increased work of breathing Abdomen: gravid, non-tender Extremities: no edema Psychiatric: mood appropriate, affect full  Assessment  35 y.o. [id number] at [redacted]w[redacted]d by  05/01/2021, by Ultrasound presenting for routine prenatal visit  Plan   Problem List Items Addressed This Visit      Other   Supervision of normal pregnancy - Primary   Relevant Orders   POC Urinalysis Dipstick OB  Other Visit Diagnoses    [redacted] weeks gestation of pregnancy       Encounter for antenatal screening for Streptococcus B       Relevant Orders   Culture, beta strep (group b only)    PNV, FMC, labor precautions  pregnancy Problems (from 12/23/20 to present)    Problem Noted Resolved   Supervision of normal pregnancy 12/23/2020 by Tresea Mall, CNM No   Overview Addendum 02/24/2021 11:13 AM by Natale Milch, MD     Nursing Staff Provider  Office Location  [facility] Dating  22 wk [place]  Language  English Anatomy US   complete Low lying placenta Fetal pelviectasis  Flu Vaccine   Genetic Screen  none  TDaP vaccine   02/24/2021 Hgb A1C or  GTT Third trimester : 98  Covid unvaccinated   LAB RESULTS   Rhogam   02/10/2021 Blood Type O/Negative/-- (05/11 1205)   Feeding Plan  breast Antibody Negative (05/11 1205)  Contraception  Rubella <0.90 (05/11 1205) NI  Circumcision  RPR Non Reactive (05/11 1205)   Pediatrician   HBsAg Negative (05/11 1205)   Support Person [name] HIV Non Reactive (05/11 1205)  Prenatal Classes  Discussed Varicella  immune    GBS  (For PCN allergy, check sensitivities)   BTL Consent     VBAC Consent  Pap  2022 NIL, HR HPV +     Hgb Electro      CF      SMA                    Annamarie Major, MD, [facility], [facility] 04/07/2021  11:55 AM

## 2021-04-11 LAB — CULTURE, BETA STREP (GROUP B ONLY): Strep Gp B Culture: NEGATIVE

## 2021-04-15 ENCOUNTER — Encounter: Payer: Self-pay | Admitting: Advanced Practice Midwife

## 2021-04-15 ENCOUNTER — Ambulatory Visit (INDEPENDENT_AMBULATORY_CARE_PROVIDER_SITE_OTHER): Payer: Medicaid Other | Admitting: Advanced Practice Midwife

## 2021-04-15 ENCOUNTER — Other Ambulatory Visit: Payer: Self-pay

## 2021-04-15 VITALS — BP 127/73 | Wt 177.0 lb

## 2021-04-15 DIAGNOSIS — Z3483 Encounter for supervision of other normal pregnancy, third trimester: Secondary | ICD-10-CM

## 2021-04-15 DIAGNOSIS — Z3A37 37 weeks gestation of pregnancy: Secondary | ICD-10-CM

## 2021-04-15 LAB — POCT URINALYSIS DIPSTICK OB
Glucose, UA: NEGATIVE
POC,PROTEIN,UA: NEGATIVE

## 2021-04-15 NOTE — Progress Notes (Signed)
  Routine Prenatal Care Visit  Subjective  Debra Brennan is a 35 y.o. G3P2002 at [redacted]w[redacted]d being seen today for ongoing prenatal care.  She is currently monitored for the following issues for this low-risk pregnancy and has Supervision of normal pregnancy on their problem list.  ----------------------------------------------------------------------------------- Patient reports no complaints.   Contractions: Not present. Vag. Bleeding: None.  Movement: Present. Leaking Fluid denies.  ----------------------------------------------------------------------------------- The following portions of the patient's history were reviewed and updated as appropriate: allergies, current medications, past family history, past medical history, past social history, past surgical history and problem list. Problem list updated.  Objective  Blood pressure 127/73, weight 177 lb (80.3 kg), last menstrual period 07/30/2020. Pregravid weight 155 lb (70.3 kg) Total Weight Gain 22 lb (9.979 kg) Urinalysis: Urine Protein Negative  Urine Glucose Negative  Fetal Status: Fetal Heart Rate (bpm): 151 Fundal Height: 38 cm Movement: Present     General:  Alert, oriented and cooperative. Patient is in no acute distress.  Skin: Skin is warm and dry. No rash noted.   Cardiovascular: Normal heart rate noted  Respiratory: Normal respiratory effort, no problems with respiration noted  Abdomen: Soft, gravid, appropriate for gestational age. Pain/Pressure: Absent     Pelvic:  Cervical exam deferred        Extremities: Normal range of motion.  Edema: None  Mental Status: Normal mood and affect. Normal behavior. Normal judgment and thought content.   Assessment   35 y.o. [id number] at [redacted]w[redacted]d by  05/01/2021, by Ultrasound presenting for routine prenatal visit  Plan   pregnancy Problems (from 12/23/20 to present)    Problem Noted Resolved   Supervision of normal pregnancy 12/23/2020 by Tresea Mall, CNM No   Overview Addendum  02/24/2021 11:13 AM by Natale Milch, MD     Nursing Staff Provider  Office Location  [facility] Dating  22 wk [place]  Language  English Anatomy US   complete Low lying placenta Fetal pelviectasis  Flu Vaccine   Genetic Screen  none  TDaP vaccine   02/24/2021 Hgb A1C or  GTT Third trimester : 98  Covid unvaccinated   LAB RESULTS   Rhogam   02/10/2021 Blood Type O/Negative/-- (05/11 1205)   Feeding Plan  breast Antibody Negative (05/11 1205)  Contraception Mirena Rubella <0.90 (05/11 1205) NI  Circumcision  RPR Non Reactive (05/11 1205)   Pediatrician   HBsAg Negative (05/11 1205)   Support Person [name] HIV Non Reactive (05/11 1205)  Prenatal Classes  Discussed Varicella  immune    GBS  (For PCN allergy, check sensitivities)   BTL Consent     VBAC Consent  Pap  2022 NIL, HR HPV +     Hgb Electro      CF      SMA                    Term labor symptoms and general obstetric precautions including but not limited to vaginal bleeding, contractions, leaking of fluid and fetal movement were reviewed in detail with the patient. Please refer to After Visit Summary for other counseling recommendations.   Return for scheduled prenatal.  Tresea Mall, [facility] 04/15/2021 11:38 AM

## 2021-04-15 NOTE — Progress Notes (Signed)
ROB - no concerns. RM 2 

## 2021-04-23 ENCOUNTER — Ambulatory Visit (INDEPENDENT_AMBULATORY_CARE_PROVIDER_SITE_OTHER): Payer: Medicaid Other | Admitting: Obstetrics

## 2021-04-23 ENCOUNTER — Other Ambulatory Visit: Payer: Self-pay

## 2021-04-23 VITALS — BP 120/80 | Wt 177.0 lb

## 2021-04-23 DIAGNOSIS — Z3483 Encounter for supervision of other normal pregnancy, third trimester: Secondary | ICD-10-CM

## 2021-04-23 DIAGNOSIS — Z3A38 38 weeks gestation of pregnancy: Secondary | ICD-10-CM

## 2021-04-23 LAB — POCT URINALYSIS DIPSTICK OB
Glucose, UA: NEGATIVE
POC,PROTEIN,UA: NEGATIVE

## 2021-04-23 NOTE — Progress Notes (Signed)
  Routine Prenatal Care Visit  Subjective  Debra Brennan is a 35 y.o. G3P2002 at [redacted]w[redacted]d being seen today for ongoing prenatal care.  She is currently monitored for the following issues for this low-risk pregnancy and has Supervision of normal pregnancy on their problem list.  ----------------------------------------------------------------------------------- Patient reports no complaints.   Contractions: Irregular. Vag. Bleeding: None.  Movement: Present. Leaking Fluid denies.  ----------------------------------------------------------------------------------- The following portions of the patient's history were reviewed and updated as appropriate: allergies, current medications, past family history, past medical history, past social history, past surgical history and problem list. Problem list updated.  Objective  Blood pressure 120/80, weight 177 lb (80.3 kg), last menstrual period 07/30/2020. Pregravid weight 155 lb (70.3 kg) Total Weight Gain 22 lb (9.979 kg) Urinalysis: Urine Protein    Urine Glucose    Fetal Status:     Movement: Present     General:  Alert, oriented and cooperative. Patient is in no acute distress.  Skin: Skin is warm and dry. No rash noted.   Cardiovascular: Normal heart rate noted  Respiratory: Normal respiratory effort, no problems with respiration noted  Abdomen: Soft, gravid, appropriate for gestational age. Pain/Pressure: Present     Pelvic:  Cervical exam performed      cervix is posterior, tight 3 cms/50%/-3, softening  Extremities: Normal range of motion.     Mental Status: Normal mood and affect. Normal behavior. Normal judgment and thought content.   Assessment   35 y.o. G3P2002 at [redacted]w[redacted]d by  05/01/2021, by Ultrasound presenting for routine prenatal visit  Plan   pregnancy Problems (from 12/23/20 to present)    Problem Noted Resolved   Supervision of normal pregnancy 12/23/2020 by Tresea Mall, CNM No   Overview Addendum 04/15/2021 11:40 AM by  Tresea Mall, CNM     Nursing Staff Provider  Office Location  [facility] Dating  22 wk [place]  Language  English Anatomy US   complete Low lying placenta Fetal pelviectasis  Flu Vaccine   Genetic Screen  none  TDaP vaccine   02/24/2021 Hgb A1C or  GTT Third trimester : 98  Covid unvaccinated   LAB RESULTS   Rhogam   02/10/2021 Blood Type O/Negative/-- (05/11 1205)   Feeding Plan  breast Antibody Negative (05/11 1205)  Contraception Mirena Rubella <0.90 (05/11 1205) NI  Circumcision  RPR Non Reactive (05/11 1205)   Pediatrician   HBsAg Negative (05/11 1205)   Support Person [name] HIV Non Reactive (05/11 1205)  Prenatal Classes  Discussed Varicella  immune    GBS  (For PCN allergy, check sensitivities) negative  BTL Consent     VBAC Consent  Pap  2022 NIL, HR HPV +     Hgb Electro      CF      SMA                    Term labor symptoms and general obstetric precautions including but not limited to vaginal bleeding, contractions, leaking of fluid and fetal movement were reviewed in detail with the patient. Please refer to After Visit Summary for other counseling recommendations.   No follow-ups on file.  Debra Brennan, CNM  04/23/2021 11:39 AM

## 2021-04-23 NOTE — Addendum Note (Signed)
Addended by: Cornelius Moras D on: 04/23/2021 11:58 AM   Modules accepted: Orders

## 2021-04-28 ENCOUNTER — Encounter: Payer: Self-pay | Admitting: Advanced Practice Midwife

## 2021-04-28 ENCOUNTER — Ambulatory Visit (INDEPENDENT_AMBULATORY_CARE_PROVIDER_SITE_OTHER): Payer: Medicaid Other | Admitting: Advanced Practice Midwife

## 2021-04-28 ENCOUNTER — Other Ambulatory Visit: Payer: Self-pay

## 2021-04-28 VITALS — BP 113/70 | Ht 64.0 in | Wt 181.6 lb

## 2021-04-28 DIAGNOSIS — Z3A39 39 weeks gestation of pregnancy: Secondary | ICD-10-CM

## 2021-04-28 DIAGNOSIS — Z3483 Encounter for supervision of other normal pregnancy, third trimester: Secondary | ICD-10-CM

## 2021-04-28 NOTE — Progress Notes (Signed)
  Routine Prenatal Care Visit  Subjective  Debra Brennan is a 35 y.o. G3P2002 at [redacted]w[redacted]d being seen today for ongoing prenatal care.  She is currently monitored for the following issues for this low-risk pregnancy and has Supervision of normal pregnancy on their problem list.  ----------------------------------------------------------------------------------- Patient reports no complaints.   Contractions: Irritability. Vag. Bleeding: None.  Movement: Present. Leaking Fluid denies.  ----------------------------------------------------------------------------------- The following portions of the patient's history were reviewed and updated as appropriate: allergies, current medications, past family history, past medical history, past social history, past surgical history and problem list. Problem list updated.  Objective  Blood pressure 113/70, height 5\' 4"  (1.626 m), weight 181 lb 9.6 oz (82.4 kg), last menstrual period 07/30/2020. Pregravid weight 155 lb (70.3 kg) Total Weight Gain 26 lb 9.6 oz (12.1 kg) Urinalysis: Urine Protein    Urine Glucose    Fetal Status: Fetal Heart Rate (bpm): 150 Fundal Height: 38 cm Movement: Present  Presentation: Vertex  General:  Alert, oriented and cooperative. Patient is in no acute distress.  Skin: Skin is warm and dry. No rash noted.   Cardiovascular: Normal heart rate noted  Respiratory: Normal respiratory effort, no problems with respiration noted  Abdomen: Soft, gravid, appropriate for gestational age. Pain/Pressure: Present     Pelvic:  Cervical exam performed Dilation: 3 Effacement (%): 70 Station: -2  Extremities: Normal range of motion.  Edema: None  Mental Status: Normal mood and affect. Normal behavior. Normal judgment and thought content.   Assessment   35 y.o. G3P2002 at [redacted]w[redacted]d by  05/01/2021, by Ultrasound presenting for routine prenatal visit  Plan   pregnancy Problems (from 12/23/20 to present)    Problem Noted Resolved   Supervision of  normal pregnancy 12/23/2020 by 12/25/2020, CNM No   Overview Addendum 04/15/2021 11:40 AM by 06/15/2021, CNM     Nursing Staff Provider  Office Location  [facility] Dating  22 wk [place] Mall  Language  English Anatomy US   complete Low lying placenta Fetal pelviectasis  Flu Vaccine   Genetic Screen  none  TDaP vaccine   02/24/2021 Hgb A1C or  GTT Third trimester : 98  Covid unvaccinated   LAB RESULTS   Rhogam   02/10/2021 Blood Type O/Negative/-- (05/11 1205)   Feeding Plan  breast Antibody Negative (05/11 1205)  Contraception Mirena Rubella <0.90 (05/11 1205) NI  Circumcision  RPR Non Reactive (05/11 1205)   Pediatrician   HBsAg Negative (05/11 1205)   Support Person [name] HIV Non Reactive (05/11 1205)  Prenatal Classes  Discussed Varicella  immune    GBS  (For PCN allergy, check sensitivities)   BTL Consent     VBAC Consent  Pap  2022 NIL, HR HPV +     Hgb Electro      CF      SMA                    Term labor symptoms and general obstetric precautions including but not limited to vaginal bleeding, contractions, leaking of fluid and fetal movement were reviewed in detail with the patient. Please refer to After Visit Summary for other counseling recommendations.   Return in about 1 week (around 05/05/2021) for rob.  05/07/2021, CNM 04/28/2021 11:34 AM

## 2021-05-01 ENCOUNTER — Other Ambulatory Visit: Payer: Self-pay

## 2021-05-01 ENCOUNTER — Inpatient Hospital Stay
Admission: EM | Admit: 2021-05-01 | Discharge: 2021-05-03 | DRG: 807 | Disposition: A | Discharge status: Home / Self Care | Payer: Medicaid Other | Attending: Obstetrics and Gynecology | Admitting: Obstetrics and Gynecology

## 2021-05-01 DIAGNOSIS — O99334 Smoking (tobacco) complicating childbirth: Secondary | ICD-10-CM | POA: Diagnosis present

## 2021-05-01 DIAGNOSIS — Z880 Allergy status to penicillin: Secondary | ICD-10-CM

## 2021-05-01 DIAGNOSIS — Z20822 Contact with and (suspected) exposure to covid-19: Secondary | ICD-10-CM | POA: Diagnosis present

## 2021-05-01 DIAGNOSIS — Z3A40 40 weeks gestation of pregnancy: Secondary | ICD-10-CM | POA: Diagnosis not present

## 2021-05-01 DIAGNOSIS — O26893 Other specified pregnancy related conditions, third trimester: Secondary | ICD-10-CM | POA: Diagnosis present

## 2021-05-01 DIAGNOSIS — O480 Post-term pregnancy: Secondary | ICD-10-CM | POA: Diagnosis not present

## 2021-05-01 DIAGNOSIS — Z3483 Encounter for supervision of other normal pregnancy, third trimester: Secondary | ICD-10-CM

## 2021-05-01 DIAGNOSIS — F17200 Nicotine dependence, unspecified, uncomplicated: Secondary | ICD-10-CM | POA: Diagnosis present

## 2021-05-01 MED ORDER — AMMONIA AROMATIC IN INHA
RESPIRATORY_TRACT | Status: AC
  Filled 2021-05-01: qty 10

## 2021-05-01 MED ORDER — ACETAMINOPHEN 325 MG PO TABS: 650.0000 mg | ORAL_TABLET | ORAL | Status: DC | PRN

## 2021-05-01 MED ORDER — OXYTOCIN BOLUS FROM INFUSION
333.0000 mL | Freq: Once | INTRAVENOUS | Status: AC
  Administered 2021-05-02: 333 mL via INTRAVENOUS

## 2021-05-01 MED ORDER — SOD CITRATE-CITRIC ACID 500-334 MG/5ML PO SOLN: 30.0000 mL | ORAL | Status: DC | PRN

## 2021-05-01 MED ORDER — OXYTOCIN 10 UNIT/ML IJ SOLN
INTRAMUSCULAR | Status: AC
  Filled 2021-05-01: qty 2

## 2021-05-01 MED ORDER — LIDOCAINE HCL (PF) 1 % IJ SOLN
INTRAMUSCULAR | Status: AC
  Filled 2021-05-01: qty 30

## 2021-05-01 MED ORDER — LIDOCAINE HCL (PF) 1 % IJ SOLN: 30.0000 mL | INTRAMUSCULAR | Status: DC | PRN

## 2021-05-01 MED ORDER — LACTATED RINGERS IV SOLN: INTRAVENOUS | Status: DC

## 2021-05-01 MED ORDER — MISOPROSTOL 200 MCG PO TABS
ORAL_TABLET | ORAL | Status: AC
  Filled 2021-05-01: qty 4

## 2021-05-01 MED ORDER — ONDANSETRON HCL 4 MG/2ML IJ SOLN: 4.0000 mg | Freq: Four times a day (QID) | INTRAMUSCULAR | Status: DC | PRN

## 2021-05-01 MED ORDER — OXYTOCIN-SODIUM CHLORIDE 30-0.9 UT/500ML-% IV SOLN
2.5000 [IU]/h | INTRAVENOUS | Status: DC
  Administered 2021-05-02: 2.5 [IU]/h via INTRAVENOUS
  Filled 2021-05-01: qty 500

## 2021-05-01 MED ORDER — LACTATED RINGERS IV SOLN: 500.0000 mL | INTRAVENOUS | Status: DC | PRN

## 2021-05-01 NOTE — H&P (Signed)
Debra Brennan is an 35 y.o. female.   Chief Complaint: uterine contractions HPI: Patient presented today with complaints of contractions. She was noted to be 6 cm.  Reports contractions have been present since yesterday but worsened about an hour ago. She has made cervical changes from earlier this week. She denies vaginal bleeding or leakage of fluid. Feeling normal fetal movements  pregnancy Problems (from 12/23/20 to present)     Problem Noted Resolved   Supervision of normal pregnancy 12/23/2020 by Tre[place] Mall, CNM No   Overview Addendum 04/15/2021 11:40 AM by Tresea Mall, CNM     Nursing Staff Provider  Office Location  [facility] Dating  22 wk [place]  Language  English Anatomy US   complete Low lying placenta Fetal pelviectasis  Flu Vaccine   Genetic Screen  none  TDaP vaccine   02/24/2021 Hgb A1C or  GTT Third trimester : 98  Covid unvaccinated   LAB RESULTS   Rhogam   02/10/2021 Blood Type O/Negative/-- (05/11 1205)   Feeding Plan  breast Antibody Negative (05/11 1205)  Contraception Mirena Rubella <0.90 (05/11 1205) NI  Circumcision  RPR Non Reactive (05/11 1205)   Pediatrician   HBsAg Negative (05/11 1205)   Support Person [name] HIV Non Reactive (05/11 1205)  Prenatal Classes  Discussed Varicella  immune    GBS  (For PCN allergy, check sensitivities)   BTL Consent     VBAC Consent  Pap  2022 NIL, HR HPV +     Hgb Electro      CF      SMA                    Past Medical History:  Diagnosis Date   Abnormal menstrual periods     Past Surgical History:  Procedure Laterality Date   NO PAST SURGERIES      No family history on file. Social History:  reports that she has been smoking. She has never used smokeless tobacco. She reports that she does not drink alcohol and does not use drugs.  Allergies: No Known Allergies  Medications Prior to Admission  Medication Sig Dispense Refill   Prenatal Vit-Fe Fumarate-FA (MULTIVITAMIN-PRENATAL) 27-0.8 MG TABS tablet Take  1 tablet by mouth daily at 12 noon.      No results found for this or any previous visit (from the past 48 hour(s)). No results found.  Review of Systems  Constitutional:  Negative for chills and fever.  HENT:  Negative for congestion, hearing loss and sinus pain.   Respiratory:  Negative for cough, shortness of breath and wheezing.   Cardiovascular:  Negative for chest pain, palpitations and leg swelling.  Gastrointestinal:  Negative for abdominal pain, constipation, diarrhea, nausea and vomiting.  Genitourinary:  Negative for dysuria, flank pain, frequency, hematuria and urgency.  Musculoskeletal:  Negative for back pain.  Skin:  Negative for rash.  Neurological:  Negative for dizziness and headaches.  Psychiatric/Behavioral:  Negative for suicidal ideas. The patient is not nervous/anxious.    Last menstrual period 07/30/2020. Physical Exam Vitals and nursing note reviewed.  Constitutional:      Appearance: Normal appearance. She is well-developed.  HENT:     Head: Normocephalic and atraumatic.  Cardiovascular:     Rate and Rhythm: Normal rate and regular rhythm.  Pulmonary:     Effort: Pulmonary effort is normal.     Breath sounds: Normal breath sounds.  Abdominal:     General: Bowel sounds are normal.  Palpations: Abdomen is soft.  Musculoskeletal:        General: Normal range of motion.  Skin:    General: Skin is warm and dry.  Neurological:     Mental Status: She is alert and oriented to person, place, and time.  Psychiatric:        Behavior: Behavior normal.        Thought Content: Thought content normal.        Judgment: Judgment normal.     NST: 130 bpm baseline, moderate variability, 15x15 accelerations, no decelerations. Tocometer : every 3-5 minutes  Assessment/Plan 35 y.o. [id number] [redacted]w[redacted]d here for labor   Normal fetal monitoring per unit policy Reviewed option for pain management with patient. Patient does desire and epidural.  Normal admission  labs GBS: negative  Adelene Idler MD, [facility], [facility] 05/01/2021 11:04 PM

## 2021-05-02 ENCOUNTER — Inpatient Hospital Stay: Payer: Medicaid Other | Admitting: Anesthesiology

## 2021-05-02 ENCOUNTER — Encounter: Payer: Self-pay | Admitting: Obstetrics and Gynecology

## 2021-05-02 DIAGNOSIS — Z3A40 40 weeks gestation of pregnancy: Secondary | ICD-10-CM

## 2021-05-02 DIAGNOSIS — O480 Post-term pregnancy: Secondary | ICD-10-CM

## 2021-05-02 LAB — CBC
HCT: 34.6 % — ABNORMAL LOW (ref 36.0–46.0)
HCT: 36.6 % (ref 36.0–46.0)
Hemoglobin: 11.5 g/dL — ABNORMAL LOW (ref 12.0–15.0)
Hemoglobin: 12.2 g/dL (ref 12.0–15.0)
MCH: 30.6 pg (ref 26.0–34.0)
MCH: 30.7 pg (ref 26.0–34.0)
MCHC: 33.2 g/dL (ref 30.0–36.0)
MCHC: 33.3 g/dL (ref 30.0–36.0)
MCV: 91.7 fL (ref 80.0–100.0)
MCV: 92.3 fL (ref 80.0–100.0)
Platelets: 227 10*3/uL (ref 150–400)
Platelets: 256 10*3/uL (ref 150–400)
RBC: 3.75 MIL/uL — ABNORMAL LOW (ref 3.87–5.11)
RBC: 3.99 MIL/uL (ref 3.87–5.11)
RDW: 14 % (ref 11.5–15.5)
RDW: 14 % (ref 11.5–15.5)
WBC: 15 10*3/uL — ABNORMAL HIGH (ref 4.0–10.5)
WBC: 18.6 10*3/uL — ABNORMAL HIGH (ref 4.0–10.5)
nRBC: 0.1 % (ref 0.0–0.2)
nRBC: 0.2 % (ref 0.0–0.2)

## 2021-05-02 LAB — TYPE AND SCREEN
ABO/RH(D): O NEG
Antibody Screen: POSITIVE

## 2021-05-02 LAB — RAPID HIV SCREEN (HIV 1/2 AB+AG)
HIV 1/2 Antibodies: NONREACTIVE
HIV-1 P24 Antigen - HIV24: NONREACTIVE

## 2021-05-02 LAB — RPR: RPR Ser Ql: NONREACTIVE

## 2021-05-02 LAB — RESP PANEL BY RT-PCR (FLU A&B, COVID) ARPGX2
Influenza A by PCR: NEGATIVE
Influenza B by PCR: NEGATIVE
SARS Coronavirus 2 by RT PCR: NEGATIVE

## 2021-05-02 MED ORDER — DIPHENHYDRAMINE HCL 50 MG/ML IJ SOLN
12.5000 mg | INTRAMUSCULAR | Status: DC | PRN
Start: 2021-05-02 — End: 2021-05-02

## 2021-05-02 MED ORDER — METHYLERGONOVINE MALEATE 0.2 MG/ML IJ SOLN
INTRAMUSCULAR | Status: AC
  Filled 2021-05-02: qty 1

## 2021-05-02 MED ORDER — DOCUSATE SODIUM 100 MG PO CAPS
100.0000 mg | ORAL_CAPSULE | Freq: Two times a day (BID) | ORAL | Status: DC
  Administered 2021-05-02: 100 mg via ORAL
  Filled 2021-05-02: qty 1

## 2021-05-02 MED ORDER — ACETAMINOPHEN 500 MG PO TABS
1000.0000 mg | ORAL_TABLET | Freq: Four times a day (QID) | ORAL | Status: DC
  Administered 2021-05-02 – 2021-05-03 (×5): 1000 mg via ORAL
  Filled 2021-05-02 (×5): qty 2

## 2021-05-02 MED ORDER — BENZOCAINE-MENTHOL 20-0.5 % EX AERO
INHALATION_SPRAY | CUTANEOUS | Status: AC
  Filled 2021-05-02: qty 56

## 2021-05-02 MED ORDER — LIDOCAINE HCL (PF) 1 % IJ SOLN
INTRAMUSCULAR | Status: DC | PRN
  Administered 2021-05-02: 3 mL via SUBCUTANEOUS

## 2021-05-02 MED ORDER — PHENYLEPHRINE 40 MCG/ML (10ML) SYRINGE FOR IV PUSH (FOR BLOOD PRESSURE SUPPORT): 80.0000 ug | PREFILLED_SYRINGE | INTRAVENOUS | Status: DC | PRN

## 2021-05-02 MED ORDER — FENTANYL-BUPIVACAINE-NACL 0.5-0.125-0.9 MG/250ML-% EP SOLN
EPIDURAL | Status: AC
  Filled 2021-05-02: qty 250

## 2021-05-02 MED ORDER — DIPHENHYDRAMINE HCL 25 MG PO CAPS: 25.0000 mg | ORAL_CAPSULE | Freq: Four times a day (QID) | ORAL | Status: DC | PRN

## 2021-05-02 MED ORDER — MEASLES, MUMPS & RUBELLA VAC IJ SOLR
0.5000 mL | Freq: Once | INTRAMUSCULAR | Status: DC
  Filled 2021-05-02: qty 0.5

## 2021-05-02 MED ORDER — FENTANYL-BUPIVACAINE-NACL 0.5-0.125-0.9 MG/250ML-% EP SOLN: 12.0000 mL/h | EPIDURAL | Status: DC | PRN

## 2021-05-02 MED ORDER — SIMETHICONE 80 MG PO CHEW: 80.0000 mg | CHEWABLE_TABLET | ORAL | Status: DC | PRN

## 2021-05-02 MED ORDER — ONDANSETRON HCL 4 MG/2ML IJ SOLN: 4.0000 mg | INTRAMUSCULAR | Status: DC | PRN

## 2021-05-02 MED ORDER — BENZOCAINE-MENTHOL 20-0.5 % EX AERO
1.0000 "application " | INHALATION_SPRAY | CUTANEOUS | Status: DC | PRN
  Administered 2021-05-02: 1 via TOPICAL

## 2021-05-02 MED ORDER — PRENATAL MULTIVITAMIN CH
1.0000 | ORAL_TABLET | Freq: Every day | ORAL | Status: DC
  Administered 2021-05-02: 1 via ORAL
  Filled 2021-05-02: qty 1

## 2021-05-02 MED ORDER — WITCH HAZEL-GLYCERIN EX PADS: 1.0000 "application " | MEDICATED_PAD | CUTANEOUS | Status: DC | PRN

## 2021-05-02 MED ORDER — LACTATED RINGERS IV SOLN
500.0000 mL | Freq: Once | INTRAVENOUS | Status: AC
  Administered 2021-05-02: 500 mL via INTRAVENOUS

## 2021-05-02 MED ORDER — LIDOCAINE-EPINEPHRINE (PF) 1.5 %-1:200000 IJ SOLN
INTRAMUSCULAR | Status: DC | PRN
  Administered 2021-05-02: 3 mL via EPIDURAL
  Administered 2021-05-02: 2 mL via EPIDURAL

## 2021-05-02 MED ORDER — DIBUCAINE (PERIANAL) 1 % EX OINT: 1.0000 "application " | TOPICAL_OINTMENT | CUTANEOUS | Status: DC | PRN

## 2021-05-02 MED ORDER — COCONUT OIL OIL
1.0000 "application " | TOPICAL_OIL | Status: DC | PRN
  Administered 2021-05-02: 1 via TOPICAL
  Filled 2021-05-02: qty 120

## 2021-05-02 MED ORDER — EPHEDRINE 5 MG/ML INJ: 10.0000 mg | INTRAVENOUS | Status: DC | PRN

## 2021-05-02 MED ORDER — ZOLPIDEM TARTRATE 5 MG PO TABS: 5.0000 mg | ORAL_TABLET | Freq: Every evening | ORAL | Status: DC | PRN

## 2021-05-02 MED ORDER — METHYLERGONOVINE MALEATE 0.2 MG/ML IJ SOLN
0.2000 mg | Freq: Once | INTRAMUSCULAR | Status: AC
  Administered 2021-05-02: 0.2 mg via INTRAMUSCULAR

## 2021-05-02 MED ORDER — ONDANSETRON HCL 4 MG PO TABS: 4.0000 mg | ORAL_TABLET | ORAL | Status: DC | PRN

## 2021-05-02 MED ORDER — FENTANYL-BUPIVACAINE-NACL 0.5-0.125-0.9 MG/250ML-% EP SOLN
EPIDURAL | Status: DC | PRN
  Administered 2021-05-02: 12 mL/h via EPIDURAL

## 2021-05-02 MED ORDER — SODIUM CHLORIDE 0.9 % IV SOLN
INTRAVENOUS | Status: DC | PRN
  Administered 2021-05-02 (×2): 4 mL via EPIDURAL

## 2021-05-02 MED ORDER — IBUPROFEN 600 MG PO TABS
600.0000 mg | ORAL_TABLET | Freq: Four times a day (QID) | ORAL | Status: DC
  Administered 2021-05-02 – 2021-05-03 (×5): 600 mg via ORAL
  Filled 2021-05-02 (×5): qty 1

## 2021-05-02 NOTE — Progress Notes (Signed)
Post Partum Day 0 Subjective: no complaints, up ad lib, voiding, and tolerating PO  Objective: Blood pressure 126/71, pulse 76, temperature 98.4 F (36.9 C), temperature source Oral, resp. rate 20, height 5\' 4"  (1.626 m), weight 82.1 kg, last menstrual period 07/30/2020, SpO2 99 %, unknown if currently breastfeeding.  Physical Exam:  General: cooperative and fatigued Lochia: appropriate Uterine Fundus: firm Incision: na DVT Evaluation: No evidence of DVT seen on physical exam. Negative Homan's sign.  Recent Labs    05/01/21 2351 05/02/21 0622  HGB 12.2 11.5*  HCT 36.6 34.6*    Assessment/Plan:  Continue routine postpartum orders. Plan for discharge tomorrow and Breastfeeding   LOS: 1 day   05/04/21 05/02/2021, 12:07 PM

## 2021-05-02 NOTE — Discharge Instructions (Addendum)
ROS  OBGyn Exam   Discharge Instructions:   Follow-up Appointment: 1 week, please call the office to schedule  If there are any new medications, they have been ordered and will be available for pickup at the listed pharmacy on your way home from the hospital.   Call the office if you have any of the following: headache, visual changes, fever >101.0 F, chills, shortness of breath, breast concerns, excessive vaginal bleeding, incision drainage or problems, leg pain or redness, depression or any other concerns. If you have vaginal discharge with an odor, let your doctor know.   It is normal to bleed for up to 6 weeks. You should not soak through more than 1 pad in 1 hour. If you have a blood clot larger than your fist with continued bleeding, call your doctor.   No intercourse, tampons, swimming pools, hot tubs, baths (only showers) for 6 weeks.  No driving for 1-2 weeks. Do not drive while taking narcotic or opioid pain medication.  Continue taking your prenatal vitamin, especially if breastfeeding. Increase calories and fluids (water) while breastfeeding.   Your milk will come in, in the next couple of days (right now it is colostrum). You may have a slight fever when your milk comes in, but it should go away on its own.  If it does not, and rises above 101 F please call the doctor. You will also feel achy and your breasts will be firm. They will also start to leak. If you are breastfeeding, continue as you have been and you can pump/express milk for comfort.   If you have too much milk, your breasts can become engorged, which could lead to mastitis. This is an infection of the milk ducts. It can be very painful and you will need to notify your doctor to obtain a prescription for antibiotics. You can also treat it with a shower or hot/cold compress.   For concerns about your baby, please call your pediatrician.  For breastfeeding concerns, the lactation consultant can be reached at  [phone].   Postpartum blues (feelings of happy one minute and sad another minute) are normal for the first few weeks but if it gets worse let your doctor know.   Congratulations! We enjoyed caring for you and your new bundle of joy!

## 2021-05-02 NOTE — Anesthesia Preprocedure Evaluation (Signed)
Anesthesia Evaluation  Patient identified by MRN, date of birth, ID band Patient awake    Reviewed: Allergy & Precautions, H&P , NPO status , Patient's Chart, lab work & pertinent test results, reviewed documented beta blocker date and time   History of Anesthesia Complications Negative for: history of anesthetic complications  Airway Mallampati: II  TM Distance: >3 FB Neck ROM: full    Dental  (+) Dental Advidsory Given, Teeth Intact   Pulmonary neg pulmonary ROS, neg shortness of breath, neg recent URI, Current Smoker,    Pulmonary exam normal breath sounds clear to auscultation       Cardiovascular Exercise Tolerance: Good negative cardio ROS Normal cardiovascular exam Rhythm:regular Rate:Normal     Neuro/Psych negative neurological ROS  negative psych ROS   GI/Hepatic Neg liver ROS, GERD  ,  Endo/Other  negative endocrine ROS  Renal/GU negative Renal ROS  negative genitourinary   Musculoskeletal   Abdominal   Peds  Hematology negative hematology ROS (+)   Anesthesia Other Findings Past Medical History: No date: Abnormal menstrual periods   Reproductive/Obstetrics (+) Pregnancy                             Anesthesia Physical Anesthesia Plan  ASA: 2  Anesthesia Plan: Epidural   Post-op Pain Management:    Induction:   PONV Risk Score and Plan:   Airway Management Planned:   Additional Equipment:   Intra-op Plan:   Post-operative Plan:   Informed Consent: I have reviewed the patients History and Physical, chart, labs and discussed the procedure including the risks, benefits and alternatives for the proposed anesthesia with the patient or authorized representative who has indicated his/her understanding and acceptance.     Dental Advisory Given  Plan Discussed with: Anesthesiologist, CRNA and Surgeon  Anesthesia Plan Comments:         Anesthesia Quick  Evaluation

## 2021-05-02 NOTE — Anesthesia Procedure Notes (Signed)
Epidural Patient location during procedure: OB Start time: 05/02/2021 12:38 AM End time: 05/02/2021 12:44 AM  Staffing Anesthesiologist: [name], MD Performed: anesthesiologist   Preanesthetic Checklist Completed: patient identified, IV checked, site marked, risks and benefits discussed, surgical consent, monitors and equipment checked, pre-op evaluation and timeout performed  Epidural Patient position: sitting Prep: ChloraPrep Patient monitoring: heart rate, continuous pulse ox and blood pressure Approach: midline Location: L4-L5 Injection technique: LOR saline  Needle:  Needle type: Tuohy  Needle gauge: 17 G Needle length: 9 cm and 9 Needle insertion depth: 5.5 cm Catheter type: closed end flexible Catheter size: 19 Gauge Catheter at skin depth: 10.5 cm Test dose: negative and 1.5% lidocaine with Epi 1:200 K  Assessment Sensory level: T10 Events: blood not aspirated, injection not painful, no injection resistance, no paresthesia and negative IV test  Additional Notes 1st attempt Pt. Evaluated and documentation done after procedure finished. Patient identified. Risks/Benefits/Options discussed with patient including but not limited to bleeding, infection, nerve damage, paralysis, failed block, incomplete pain control, headache, blood pressure changes, nausea, vomiting, reactions to medication both or allergic, itching and postpartum back pain. Confirmed with bedside nurse the patient's most recent platelet count. Confirmed with patient that they are not currently taking any anticoagulation, have any bleeding history or any family history of bleeding disorders. Patient expressed understanding and wished to proceed. All questions were answered. Sterile technique was used throughout the entire procedure. Please see nursing notes for vital signs. Test dose was given through epidural catheter and negative prior to continuing to dose epidural or start infusion. Warning signs of  high block given to the patient including shortness of breath, tingling/numbness in hands, complete motor block, or any concerning symptoms with instructions to call for help. Patient was given instructions on fall risk and not to get out of bed. All questions and concerns addressed with instructions to call with any issues or inadequate analgesia.    Patient tolerated the insertion well without immediate complications.Reason for block:procedure for pain

## 2021-05-02 NOTE — Discharge Summary (Signed)
Postpartum Discharge Summary  Date of Service updated 05/03/2021     Patient Name: Debra Brennan DOB: 09-Feb-1986 MRN: [id number]  Date of admission: 05/01/2021 Delivery date:05/02/2021  Delivering provider: Adrian Prows R  Date of discharge: 05/03/2021  Admitting diagnosis: Normal labor [O80, Z37.9] Intrauterine pregnancy: [redacted]w[redacted]d    Secondary diagnosis:  Active Problems:   Normal labor   Postpartum care following vaginal delivery  Additional problems: none    Discharge diagnosis: Term Pregnancy Delivered                                              Post partum procedures: none Augmentation: N/A Complications: None  Hospital course: Onset of Labor With Vaginal Delivery      35y.o. yo G3P2002 at 43w1das admitted in Active Labor on 05/01/2021. Patient had an uncomplicated labor course as follows:  Membrane Rupture Time/Date: 2:26 AM ,05/02/2021   Delivery Method:Vaginal, Spontaneous  Episiotomy: None  Lacerations:  1st degree  Patient had an uncomplicated postpartum course.  She is ambulating, tolerating a regular diet, passing flatus, and urinating well. Patient is discharged home in stable condition on 05/03/21.  Newborn Data: Birth date:05/02/2021  Birth time:2:58 AM  Gender:Female  Living status:Living  Apgars:8 ,9  Weight:3030 g   Magnesium Sulfate received: No BMZ received: No Rhophylac:N/A MMR:No T-DaP:Given prenatally Flu: No Transfusion:No  Physical exam  Vitals:   05/02/21 1635 05/02/21 2007 05/02/21 2313 05/03/21 0808  BP: 124/81 128/78 120/79 129/78  Pulse: 66 80 77 72  Resp: _0 Temp: 98.3 F (36.8 C) 98.6 F (37 C) 98.3 F (36.8 C) 97.7 F (36.5 C)  TempSrc: Oral Oral Oral Oral  SpO2:  97% 98% 99%  Weight:      Height:       General: alert, cooperative, and no distress Lochia: appropriate Uterine Fundus: firm Incision: N/A DVT Evaluation: No evidence of DVT seen on physical exam. Negative Homan's sign. Labs: Lab Results   Component Value Date   WBC 18.6 (H) 05/02/2021   HGB 11.5 (L) 05/02/2021   HCT 34.6 (L) 05/02/2021   MCV 92.3 05/02/2021   PLT 227 05/02/2021   No flowsheet data found. Edinburgh Score: Edinburgh Postnatal Depression Scale Screening Tool 05/02/2021  I have been able to laugh and see the funny side of things. 0  I have looked forward with enjoyment to things. 0  I have blamed myself unnecessarily when things went wrong. 0  I have been anxious or worried for no good reason. 0  I have felt scared or panicky for no good reason. 0  Things have been getting on top of me. 0  I have been so unhappy that I have had difficulty sleeping. 0  I have felt sad or miserable. 0  I have been so unhappy that I have been crying. 0  The thought of harming myself has occurred to me. 0  Edinburgh Postnatal Depression Scale Total 0      After visit meds:  Allergies as of 05/03/2021   No Known Allergies      Medication List     TAKE these medications    ibuprofen 600 MG tablet Commonly known as: ADVIL Take 1 tablet (600 mg total) by mouth every 6 (six) hours.   multivitamin-prenatal 27-0.8 MG Tabs tablet Take 1 tablet by mouth daily  at 12 noon.         Discharge home in stable condition Infant Feeding: Breast Infant Disposition:home with mother Discharge instruction: per After Visit Summary and Postpartum booklet. Activity: Advance as tolerated. Pelvic rest for 6 weeks.  Diet: routine diet Anticipated Birth Control: POPs Postpartum Appointment:2 weeks Additional Postpartum F/U: Postpartum Depression checkup Future Appointments: Future Appointments  Date Time Provider [place]  05/05/2021 11:10 AM Imagene Riches, CNM [facility] None   Follow up Visit:  Follow-up Information     Schuman, Stefanie Libel, MD. Schedule an appointment as soon as possible for a visit in 2 week(s).   Specialty: Obstetrics and Gynecology Why: for postpartum visit Contact information: [address] [place] [address] [phone]                     05/03/2021 Imagene Riches, CNM

## 2021-05-03 ENCOUNTER — Encounter: Payer: Self-pay | Admitting: Obstetrics and Gynecology

## 2021-05-03 LAB — ABO/RH: ABO/RH(D): O NEG

## 2021-05-03 MED ORDER — IBUPROFEN 600 MG PO TABS: 600.0000 mg | ORAL_TABLET | Freq: Four times a day (QID) | ORAL | 0 refills | Status: AC

## 2021-05-03 NOTE — Progress Notes (Signed)
Pt discharged with infant. Discharge instructions, prescriptions, and follow up appointments given to and reviewed with patient. Pt verbalized understanding. Escorted out by auxillary.  

## 2021-05-03 NOTE — Anesthesia Post-op Follow-up Note (Signed)
  Anesthesia Pain Follow-up Note  Patient: Debra Brennan  Day #: 1  Date of Follow-up: 05/03/2021 Time: 11:59 AM  Last Vitals:  Vitals:   05/02/21 2313 05/03/21 0808  BP: 120/79 129/78  Pulse: 77 72  Resp: 18 20  Temp: 36.8 C 36.5 C  SpO2: 98% 99%    Level of Consciousness: alert  Pain: none   Side Effects:None  Catheter Site Exam:clean, dry     Plan: D/C from anesthesia care at surgeon's request  [name]

## 2021-05-03 NOTE — Anesthesia Postprocedure Evaluation (Signed)
Anesthesia Post Note  Patient: Debra Brennan  Procedure(s) Performed: AN AD HOC LABOR EPIDURAL  Patient location during evaluation: Mother Baby Anesthesia Type: Epidural Level of consciousness: awake and alert Pain management: pain level controlled Vital Signs Assessment: post-procedure vital signs reviewed and stable Respiratory status: spontaneous breathing, nonlabored ventilation and respiratory function stable Cardiovascular status: stable Postop Assessment: no headache, no backache, able to ambulate, no apparent nausea or vomiting and adequate PO intake Anesthetic complications: no   No notable events documented.   Last Vitals:  Vitals:   05/02/21 2313 05/03/21 0808  BP: 120/79 129/78  Pulse: 77 72  Resp: 18 20  Temp: 36.8 C 36.5 C  SpO2: 98% 99%    Last Pain:  Vitals:   05/03/21 0808  TempSrc: Oral  PainSc:                  [name]

## 2021-05-05 ENCOUNTER — Encounter: Payer: Medicaid Other | Admitting: Obstetrics

## 2021-05-18 ENCOUNTER — Telehealth: Payer: Self-pay

## 2021-05-18 ENCOUNTER — Encounter: Payer: Self-pay | Admitting: Obstetrics and Gynecology

## 2021-05-18 ENCOUNTER — Other Ambulatory Visit: Payer: Self-pay

## 2021-05-18 ENCOUNTER — Ambulatory Visit (INDEPENDENT_AMBULATORY_CARE_PROVIDER_SITE_OTHER): Payer: Medicaid Other | Admitting: Obstetrics and Gynecology

## 2021-05-18 DIAGNOSIS — N760 Acute vaginitis: Secondary | ICD-10-CM

## 2021-05-18 DIAGNOSIS — B9689 Other specified bacterial agents as the cause of diseases classified elsewhere: Secondary | ICD-10-CM

## 2021-05-18 MED ORDER — METRONIDAZOLE 500 MG PO TABS: 500.0000 mg | ORAL_TABLET | Freq: Two times a day (BID) | ORAL | 0 refills | Status: AC

## 2021-05-18 NOTE — Progress Notes (Signed)
Postpartum Visit  Chief Complaint:  Chief Complaint  Patient presents with   Postpartum Care    History of Present Illness: Patient is a 35 y.o. [id number] presents for postpartum visit.  Date of delivery: 05/02/2021 Type of delivery: vaginal Laceration: left labial  Pregnancy or labor problems: reports a vaginal odor  Breast Feeding:  no Lochia: spotting Post partum depression/anxiety noted:  no Edinburgh Post-Partum Depression Score: 0  Date of last PAP: 2022 NIL, HPV+   Any problems since the delivery:  vaginal odor  She reports she has been feeling well. Taking sitz baths. Notes labial tenderness and vaginal odor.   Newborn Details:  SINGLETON :  1. Baby Gender:female.  Infant Status: Infant doing well at home with mother.   Review of Systems: Review of Systems  Constitutional:  Negative for chills, fever, malaise/fatigue and weight loss.  HENT:  Negative for congestion, hearing loss and sinus pain.   Eyes:  Negative for blurred vision and double vision.  Respiratory:  Negative for cough, sputum production, shortness of breath and wheezing.   Cardiovascular:  Negative for chest pain, palpitations, orthopnea and leg swelling.  Gastrointestinal:  Negative for abdominal pain, constipation, diarrhea, nausea and vomiting.  Genitourinary:  Negative for dysuria, flank pain, frequency, hematuria and urgency.  Musculoskeletal:  Negative for back pain, falls and joint pain.  Skin:  Negative for itching and rash.  Neurological:  Negative for dizziness and headaches.  Psychiatric/Behavioral:  Negative for depression, substance abuse and suicidal ideas. The patient is not nervous/anxious.    Past Medical History:  Past Medical History:  Diagnosis Date   Abnormal menstrual periods     Past Surgical History:  Past Surgical History:  Procedure Laterality Date   NO PAST SURGERIES      Family History:  History reviewed. No pertinent family history.  Social History:   Social History   Socioeconomic History   Marital status: Single    Spouse name: Not on file   Number of children: Not on file   Years of education: Not on file   Highest education level: Not on file  Occupational History   Not on file  Tobacco Use   Smoking status: Every Day   Smokeless tobacco: Never  Substance and Sexual Activity   Alcohol use: Never   Drug use: Never   Sexual activity: Yes    Birth control/protection: None  Other Topics Concern   Not on file  Social History Narrative   Not on file   Social Determinants of Health   Financial Resource Strain: Not on file  Food Insecurity: Not on file  Transportation Needs: Not on file  Physical Activity: Not on file  Stress: Not on file  Social Connections: Not on file  Intimate Partner Violence: Not on file    Allergies:  No Known Allergies  Medications: Prior to Admission medications   Medication Sig Start Date End Date Taking? Authorizing Provider  ibuprofen (ADVIL) 600 MG tablet Take 1 tablet (600 mg total) by mouth every 6 (six) hours. 05/03/21  Yes Mirna Mires, CNM  Prenatal Vit-Fe Fumarate-FA (MULTIVITAMIN-PRENATAL) 27-0.8 MG TABS tablet Take 1 tablet by mouth daily at 12 noon.   Yes [provider]    Physical Exam Vitals:  Vitals:   05/18/21 1115  BP: 114/70    Physical Exam Constitutional:      Appearance: She is well-developed.  Genitourinary:     Genitourinary Comments: External: Normal appearing vulva. No lesions noted. Left labial  laceration healing well. No erythema. No induration. Intact laceration. Small suture present  HENT:     Head: Normocephalic and atraumatic.  Neck:     Thyroid: No thyromegaly.  Cardiovascular:     Rate and Rhythm: Normal rate and regular rhythm.     Heart sounds: Normal heart sounds.  Pulmonary:     Effort: Pulmonary effort is normal.     Breath sounds: Normal breath sounds.  Abdominal:     General: Bowel sounds are normal. There is no  distension.     Palpations: Abdomen is soft. There is no mass.  Musculoskeletal:     Cervical back: Neck supple.  Neurological:     Mental Status: She is alert and oriented to person, place, and time.  Skin:    General: Skin is warm and dry.  Psychiatric:        Behavior: Behavior normal.        Thought Content: Thought content normal.        Judgment: Judgment normal.  Vitals reviewed.    Assessment: 35 y.o. [id number] presenting for 6 week postpartum visit  Plan: Problem List Items Addressed This Visit   None Visit Diagnoses     Bacterial vaginosis    -  Primary   Relevant Medications   metroNIDAZOLE (FLAGYL) 500 MG tablet       1) Contraception-  planning Mirena IUD  2)  Pap: colposcopy needed, plan for after IUD insertion during string check visit  3) Patient underwent screening for postpartum depression with no concerns noted.  4) vaginal odor- will treat for BV with flagyl, rx sent  - Follow up in  weeks   Adelene Idler MD, [facility], [facility] 05/18/2021 11:37 AM

## 2021-05-18 NOTE — Patient Instructions (Signed)
Postpartum Care After Vaginal Delivery The following information offers guidance about how to care for yourself from the time you deliver your baby to 6-12 weeks after delivery (postpartum period). If you have problems or questions, contact your health care provider for more specific instructions. Follow these instructions at home: Vaginal bleeding It is normal to have vaginal bleeding (lochia) after delivery. Wear a sanitary pad for bleeding and discharge. During the first week after delivery, the amount and appearance of lochia is often similar to a menstrual period. Over the next few weeks, it will gradually decrease to a dry, yellow-brown discharge. For most women, lochia stops completely by 4-6 weeks after delivery, but can vary. Change your sanitary pads frequently. Watch for any changes in your flow, such as: A sudden increase in volume. A change in color. Large blood clots. If you pass a blood clot from your vagina, save it and call your health care provider. Do not flush blood clots down the toilet before talking with your health care provider. Do not use tampons or douches until your health care provider approves. If you are not breastfeeding, your period should return 6-8 weeks after delivery. If you are feeding your baby breast milk only, your period may not return until you stop breastfeeding. Perineal care  Keep the area between the vagina and the anus (perineum) clean and dry. Use medicated pads and pain-relieving sprays and creams as directed. If you had a surgical cut in the perineum (episiotomy) or a tear, check the area for signs of infection until you are healed. Check for: More redness, swelling, or pain. Fluid or blood coming from the cut or tear. Warmth. Pus or a bad smell. You may be given a squirt bottle to use instead of wiping to clean the perineum area after you use the bathroom. Pat the area gently to dry it. To relieve pain caused by an episiotomy, a tear, or  swollen veins in the anus (hemorrhoids), take a warm sitz bath 2-3 times a day. In a sitz bath, the warm water should only come up to your hips and cover your buttocks. Breast care In the first few days after delivery, your breasts may feel heavy, full, and uncomfortable (breast engorgement). Milk may also leak from your breasts. Ask your health care provider about ways to help relieve the discomfort. If you are breastfeeding: Wear a bra that supports your breasts and fits well. Use breast pads to absorb milk that leaks. Keep your nipples clean and dry. Apply creams and ointments as told. You may have uterine contractions every time you breastfeed for up to several weeks after delivery. This helps your uterus return to its normal size. If you have any problems with breastfeeding, notify your health care provider or lactation consultant. If you are not breastfeeding: Avoid touching your breasts. Do not squeeze out (express) milk. Doing this can make your breasts produce more milk. Wear a good-fitting bra and use cold packs to help with swelling. Intimacy and sexuality Ask your health care provider when you can engage in sexual activity. This may depend upon: Your risk of infection. How fast you are healing. Your comfort and desire to engage in sexual activity. You are able to get pregnant after delivery, even if you have not had your period. Talk with your health care provider about methods of birth control (contraception) or family planning if you desire future pregnancies. Medicines Take over-the-counter and prescription medicines only as told by your health care provider. Take an   over-the-counter stool softener to help ease bowel movements as told by your health care provider. If you were prescribed an antibiotic medicine, take it as told by your health care provider. Do not stop taking the antibiotic even if you start to feel better. Review all previous and current prescriptions to check for  possible transfer into breast milk. Activity Gradually return to your normal activities as told by your health care provider. Rest as much as possible. Nap while your baby is sleeping. Eating and drinking  Drink enough fluid to keep your urine pale yellow. To help prevent or relieve constipation, eat high-fiber foods every day. Choose healthy eating to support breastfeeding or weight loss goals. Take your prenatal vitamins until your health care provider tells you to stop. General tips/recommendations Do not use any products that contain nicotine or tobacco. These products include cigarettes, chewing tobacco, and vaping devices, such as e-cigarettes. If you need help quitting, ask your health care provider. Do not drink alcohol, especially if you are breastfeeding. Do not take medications or drugs that are not prescribed to you, especially if you are breastfeeding. Visit your health care provider for a postpartum checkup within the first 3-6 weeks after delivery. Complete a comprehensive postpartum visit no later than 12 weeks after delivery. Keep all follow-up visits for you and your baby. Contact a health care provider if: You feel unusually sad or worried. Your breasts become red, painful, or hard. You have a fever or other signs of an infection. You have bleeding that is soaking through one pad an hour or you have blood clots. You have a severe headache that doesn't go away or you have vision changes. You have nausea and vomiting and are unable to eat or drink anything for 24 hours. Get help right away if: You have chest pain or difficulty breathing. You have sudden, severe leg pain. You faint or have a seizure. You have thoughts about hurting yourself or your baby. If you ever feel like you may hurt yourself or others, or have thoughts about taking your own life, get help right away. Go to your nearest emergency department or: Call your local emergency services (911 in the  [place] The National Suicide Prevention Lifeline at 1-800-273-8255. This suicide crisis helpline is open 24 hours a day. Text the Crisis Text Line at 741741 (in the [place] Summary The period of time after you deliver your newborn up to 6-12 weeks after delivery is called the postpartum period. Keep all follow-up visits for you and your baby. Review all previous and current prescriptions to check for possible transfer into breast milk. Contact a health care provider if you feel unusually sad or worried during the postpartum period. This information is not intended to replace advice given to you by your health care provider. Make sure you discuss any questions you have with your health care provider. Document Revised: 05/08/2020 Document Reviewed: 05/08/2020 Elsevier Patient Education  2022 Elsevier Inc.  

## 2021-05-18 NOTE — Telephone Encounter (Signed)
Patient is scheduled for postpartum visit with Mirena IUD insertion on 06/23/21 with [facility]

## 2021-05-27 NOTE — Telephone Encounter (Signed)
Noted. Will order to arrive by apt date/time. 

## 2021-06-23 ENCOUNTER — Encounter: Payer: Self-pay | Admitting: Obstetrics and Gynecology

## 2021-06-23 ENCOUNTER — Ambulatory Visit (INDEPENDENT_AMBULATORY_CARE_PROVIDER_SITE_OTHER): Payer: Medicaid Other | Admitting: Obstetrics and Gynecology

## 2021-06-23 ENCOUNTER — Other Ambulatory Visit: Payer: Self-pay

## 2021-06-23 DIAGNOSIS — Z30430 Encounter for insertion of intrauterine contraceptive device: Secondary | ICD-10-CM | POA: Diagnosis not present

## 2021-06-23 LAB — POCT URINE PREGNANCY: Preg Test, Ur: NEGATIVE

## 2021-06-23 NOTE — Patient Instructions (Signed)
Intrauterine Device Insertion, Care After This sheet gives you information about how to care for yourself after your procedure. Your health care provider may also give you more specific instructions. If you have problems or questions, contact your health care provider. What can I expect after the procedure? After the procedure, it is common to have: Cramps and pain in the abdomen. Bleeding. It may be light or heavy. This may last for a few days. Lower back pain. Dizziness. Headaches. Nausea. Follow these instructions at home:  Before resuming sexual activity, check to make sure that you can feel the IUD string or strings. You should be able to feel the end of the string below the opening of your cervix. If your IUD string is in place, you may resume sexual activity. If you had a hormonal IUD inserted more than 7 days after your most recent period started, you will need to use a backup method of birth control for 7 days after IUD insertion. Ask your health care provider whether this applies to you. Continue to check that the IUD is still in place by feeling for the strings after every menstrual period, or once a month. An IUD will not protect you from sexually transmitted infections (STIs). Use methods to prevent the exchange of body fluids between partners (barrier protection) every time you have sex. Barrier protection can be used during oral, vaginal, or anal sex. Commonly used barrier methods include: Female condom. Female condom. Dental dam. Take over-the-counter and prescription medicines only as told by your health care provider. Keep all follow-up visits as told by your health care provider. This is important. Contact a health care provider if: You feel light-headed or weak. You have any of the following problems with your IUD string or strings: The string bothers or hurts you or your sexual partner. You cannot feel the string. The string has gotten longer. You can feel the IUD in  your vagina. You think you may be pregnant, or you miss your menstrual period. You think you may have a sexually transmitted infection (STI). Get help right away if: You have flu-like symptoms, such as tiredness (fatigue) and muscle aches. You have a fever and chills. You have bleeding that is heavier or lasts longer than a normal menstrual cycle. You have abnormal or bad-smelling discharge from your vagina. You develop abdominal pain that is new, is getting worse, or is not in the same area of earlier cramping and pain. You have pain during sexual activity. Summary After the procedure, it is common to have cramps and pain in the abdomen. It is also common to have light bleeding or heavier bleeding that is like your menstrual period. Continue to check that the IUD is still in place by feeling for the strings after every menstrual period, or once a month. Keep all follow-up visits as told by your health care provider. This is important. Contact your health care provider if you have problems with your IUD strings, such as the string getting longer or bothering you or your sexual partner. This information is not intended to replace advice given to you by your health care provider. Make sure you discuss any questions you have with your health care provider. Document Revised: 08/14/2019 Document Reviewed: 08/14/2019 Elsevier Patient Education  2022 Elsevier Inc.  

## 2021-06-23 NOTE — Progress Notes (Signed)
Postpartum Visit  Chief Complaint:  Chief Complaint  Patient presents with   Postpartum Care    History of Present Illness: Patient is a 35 y.o. [id number] presents for postpartum visit.  Date of delivery: 05/02/2021 Type of delivery: Vaginal Laceration: left labial  Pregnancy or labor problems: none  Breast Feeding:  no Lochia: none Post partum depression/anxiety noted:  no Edinburgh Post-Partum Depression Score: 0  Date of last PAP: 2022 NIL HPV+  abnormal   Any problems since the delivery:  none  She reports she has been feeling bell. No soreness or pain. Odor resolved with antibiotics  Newborn Details:  SINGLETON :  1. Baby Gender:Female.  Infant Status: Infant doing well at home with mother.   Review of Systems: Review of Systems  Constitutional:  Negative for chills, fever, malaise/fatigue and weight loss.  HENT:  Negative for congestion, hearing loss and sinus pain.   Eyes:  Negative for blurred vision and double vision.  Respiratory:  Negative for cough, sputum production, shortness of breath and wheezing.   Cardiovascular:  Negative for chest pain, palpitations, orthopnea and leg swelling.  Gastrointestinal:  Negative for abdominal pain, constipation, diarrhea, nausea and vomiting.  Genitourinary:  Negative for dysuria, flank pain, frequency, hematuria and urgency.  Musculoskeletal:  Negative for back pain, falls and joint pain.  Skin:  Negative for itching and rash.  Neurological:  Negative for dizziness and headaches.  Psychiatric/Behavioral:  Negative for depression, substance abuse and suicidal ideas. The patient is not nervous/anxious.    Past Medical History:  Past Medical History:  Diagnosis Date   Abnormal menstrual periods     Past Surgical History:  Past Surgical History:  Procedure Laterality Date   NO PAST SURGERIES      Family History:  No family history on file.  Social History:  Social History   Socioeconomic History   Marital  status: Single    Spouse name: Not on file   Number of children: Not on file   Years of education: Not on file   Highest education level: Not on file  Occupational History   Not on file  Tobacco Use   Smoking status: Every Day   Smokeless tobacco: Never  Substance and Sexual Activity   Alcohol use: Never   Drug use: Never   Sexual activity: Yes    Birth control/protection: None  Other Topics Concern   Not on file  Social History Narrative   Not on file   Social Determinants of Health   Financial Resource Strain: Not on file  Food Insecurity: Not on file  Transportation Needs: Not on file  Physical Activity: Not on file  Stress: Not on file  Social Connections: Not on file  Intimate Partner Violence: Not on file    Allergies:  No Known Allergies  Medications: Prior to Admission medications   Medication Sig Start Date End Date Taking? Authorizing Provider  ibuprofen (ADVIL) 600 MG tablet Take 1 tablet (600 mg total) by mouth every 6 (six) hours. 05/03/21  Yes Mirna Mires, CNM  metroNIDAZOLE (FLAGYL) 500 MG tablet Take 1 tablet (500 mg total) by mouth 2 (two) times daily. 05/18/21  Yes Erie Sica R, MD  Prenatal Vit-Fe Fumarate-FA (MULTIVITAMIN-PRENATAL) 27-0.8 MG TABS tablet Take 1 tablet by mouth daily at 12 noon.   Yes [provider]    Physical Exam Vitals:  Vitals:   06/23/21 1333  BP: 116/70    Physical Exam Constitutional:      Appearance:  She is well-developed.  Genitourinary:     Genitourinary Comments: External: Normal appearing vulva. No lesions noted.  Speculum examination: Normal appearing cervix. No blood in the vaginal vault. NO discharge.    HENT:     Head: Normocephalic and atraumatic.  Neck:     Thyroid: No thyromegaly.  Cardiovascular:     Rate and Rhythm: Normal rate and regular rhythm.     Heart sounds: Normal heart sounds.  Pulmonary:     Effort: Pulmonary effort is normal.     Breath sounds: Normal breath  sounds.  Abdominal:     General: Bowel sounds are normal. There is no distension.     Palpations: Abdomen is soft. There is no mass.  Musculoskeletal:     Cervical back: Neck supple.  Neurological:     Mental Status: She is alert and oriented to person, place, and time.  Skin:    General: Skin is warm and dry.  Psychiatric:        Behavior: Behavior normal.        Thought Content: Thought content normal.        Judgment: Judgment normal.  Vitals reviewed.    IUD PROCEDURE NOTE:  Holleigh Crihfield is a 35 y.o. [phone] here for IUD insertion. No GYN concerns.  Pregnancy excluded: pregnancy test negative  IUD Insertion Procedure Note Patient identified, informed consent performed, consent signed.   Discussed risks of irregular bleeding, cramping, infection, malpositioning or misplacement of the IUD outside the uterus which may require further procedure such as laparoscopy, risk of failure <1%. Time out was performed.    A bimanual exam showed the uterus to be anteverted.  Speculum placed in the vagina.  Cervix visualized.  Cleaned with Betadine x 2.  Grasped anteriorly with a single tooth tenaculum.  Uterus sounded to 7 cm.   Mirena IUD placed per manufacturer's recommendations.  Strings trimmed to 3 cm. Tenaculum was removed, good hemostasis noted.  Patient tolerated procedure well.   Patient was given post-procedure instructions.  She was advised to have backup contraception for one week.  Patient was also asked to check IUD strings periodically and follow up in 4 weeks for IUD check.  Assessment: 35 y.o. [id number] presenting for 6 week postpartum visit  Plan: Problem List Items Addressed This Visit       Other   Postpartum care following vaginal delivery - Primary   Other Visit Diagnoses     Encounter for IUD insertion           1) Contraception-  Mirena IUD inserted  2)  Pap: NIL HPV+, Colposcopy recommended, patient to schedule with IUD follow up visit.  3) Patient  underwent screening for postpartum depression with no concerns noted.  4) Follow up in 4 weeks   Adelene Idler MD, [facility], [facility] 06/23/2021 1:42 PM

## 2021-06-25 NOTE — Telephone Encounter (Signed)
Mirena rcvd/charged 06/23/21

## 2021-07-21 ENCOUNTER — Ambulatory Visit: Payer: Medicaid Other | Admitting: Obstetrics and Gynecology

## 2021-08-13 ENCOUNTER — Ambulatory Visit (INDEPENDENT_AMBULATORY_CARE_PROVIDER_SITE_OTHER): Payer: Medicaid Other | Admitting: Obstetrics and Gynecology

## 2021-08-13 ENCOUNTER — Encounter: Payer: Self-pay | Admitting: Obstetrics and Gynecology

## 2021-08-13 ENCOUNTER — Other Ambulatory Visit (HOSPITAL_COMMUNITY)
Admission: RE | Admit: 2021-08-13 | Discharge: 2021-08-13 | Disposition: A | Discharge status: Home / Self Care | Payer: Medicaid Other | Source: Ambulatory Visit | Attending: Obstetrics and Gynecology | Admitting: Obstetrics and Gynecology

## 2021-08-13 ENCOUNTER — Other Ambulatory Visit: Payer: Self-pay

## 2021-08-13 VITALS — BP 114/70 | Ht 64.0 in | Wt 157.4 lb

## 2021-08-13 DIAGNOSIS — R87618 Other abnormal cytological findings on specimens from cervix uteri: Secondary | ICD-10-CM | POA: Insufficient documentation

## 2021-08-13 DIAGNOSIS — N760 Acute vaginitis: Secondary | ICD-10-CM

## 2021-08-13 DIAGNOSIS — Z30431 Encounter for routine checking of intrauterine contraceptive device: Secondary | ICD-10-CM | POA: Diagnosis not present

## 2021-08-13 DIAGNOSIS — B9689 Other specified bacterial agents as the cause of diseases classified elsewhere: Secondary | ICD-10-CM

## 2021-08-13 MED ORDER — METRONIDAZOLE 500 MG PO TABS: 500.0000 mg | ORAL_TABLET | Freq: Two times a day (BID) | ORAL | 0 refills | Status: AC

## 2021-08-13 NOTE — Patient Instructions (Signed)
Colposcopy, Care After The following information offers guidance on how to care for yourself after your procedure. Your doctor may also give you more specific instructions. If you have problems or questions, contact your doctor. What can I expect after the procedure? If you did not have a sample of your tissue taken out (did not have a biopsy), you may only have some spotting of blood for a few days. You can go back to your normal activities. If you had a sample of your tissue taken out, it is common to have: Soreness and mild pain. These may last for a few days. Mild bleeding or fluid (discharge) coming from your vagina. The fluid will look dark and grainy. You may have this for a few days. The fluid may be caused by a liquid that was used during your procedure. You may need to wear a sanitary pad. Spotting of blood for at least 48 hours after the procedure. Follow these instructions at home: Medicines Take over-the-counter and prescription medicines only as told by your doctor. Ask your doctor what over-the-counter pain medicines and prescription medicines you can start taking again. This is very important if you take blood thinners. Activity For at least 3 days, or for as long as told by your doctor, avoid: Douching. Using tampons. Having sex. Return to your normal activities as told by your doctor. Ask your doctor what activities are safe for you. General instructions Ask your doctor if you may take baths, swim, or use a hot tub. You may take showers. If you use birth control (contraception), keep using it. Keep all follow-up visits. Contact a doctor if: You have a fever or chills. You faint or feel light-headed. Get help right away if: You bleed a lot from your vagina. A lot of bleeding means that the bleeding soaks through a pad in less than 1 hour. You have clumps of blood (blood clots) coming from your vagina. You have signs that could mean you have an infection. This may be fluid  coming from your vagina that is: Different than normal. Yellow. Bad-smelling. You have very bad pain or cramps in your lower belly that do not get better with medicine. Summary If you did not have a sample of your tissue taken out, you may only have some spotting of blood for a few days. You can go back to your normal activities. If you had a sample of your tissue taken out, it is common to have mild pain for a few days and spotting for 48 hours. Avoid douching, using tampons, and having sex for at least 3 days after the procedure or for as long as told. Get help right away if you have a lot of bleeding, very bad pain, or signs of infection. This information is not intended to replace advice given to you by your health care provider. Make sure you discuss any questions you have with your health care provider. Document Revised: 01/18/2021 Document Reviewed: 01/18/2021 Elsevier Patient Education  2022 Elsevier Inc.  

## 2021-08-13 NOTE — Progress Notes (Signed)
   GYNECOLOGY CLINIC COLPOSCOPY PROCEDURE NOTE  35 y.o. 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  Is the patient  pregnant: No LMP: No LMP recorded. (Menstrual status: IUD). Smoking status:  reports that she has been smoking cigarettes. She has been smoking an average of 1 pack per day. She has never used smokeless tobacco. Contraception: IUD- strings seen today Future fertility desired:  No  Patient given informed consent, signed copy in the chart, time out was performed.  The patient was position in dorsal lithotomy position. Speculum was placed the cervix was visualized.   After application of acetic acid colposcopic inspection of the cervix was undertaken.   Colposcopy adequate, full visualization of transformation zone: Yes acetowhite lesion(s) noted at 7 o'clock; corresponding biopsies obtained.   ECC specimen obtained:  Yes  All specimens were labeled and sent to pathology.   Patient was given post procedure instructions.  Will follow up pathology and manage accordingly.  Routine preventative health maintenance measures emphasized.  Physical Exam Genitourinary:       Adelene Idler MD [facility], [facility] 08/13/2021 2:41 PM

## 2021-08-17 LAB — SURGICAL PATHOLOGY

## 2023-01-01 IMAGING — US US OB COMP +14 WK
1 series · 13 of 28 positions shown · non-contrast
Comparison: none

CLINICAL DATA: Fetal anatomy evaluation

EXAM:
OBSTETRICAL ULTRASOUND >14 WKS

[Series 1: us ob comp +14 wk · 0.22mm/px · 13 of 77 slices shown]
[im 3/77]
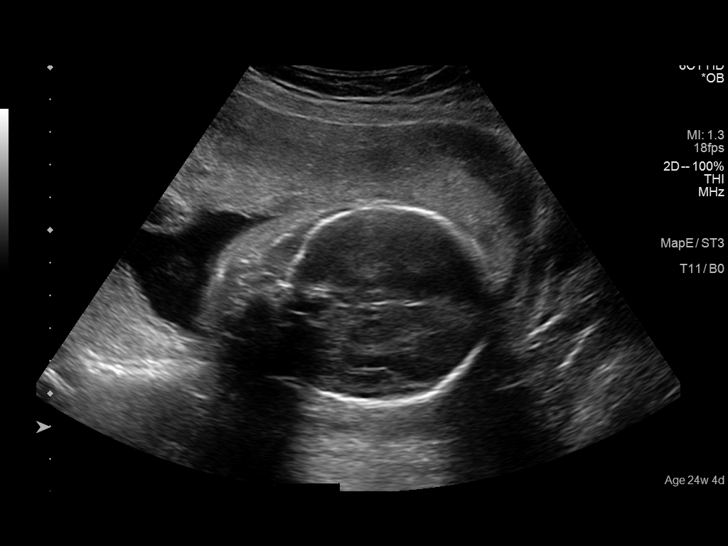
[im 9/77]
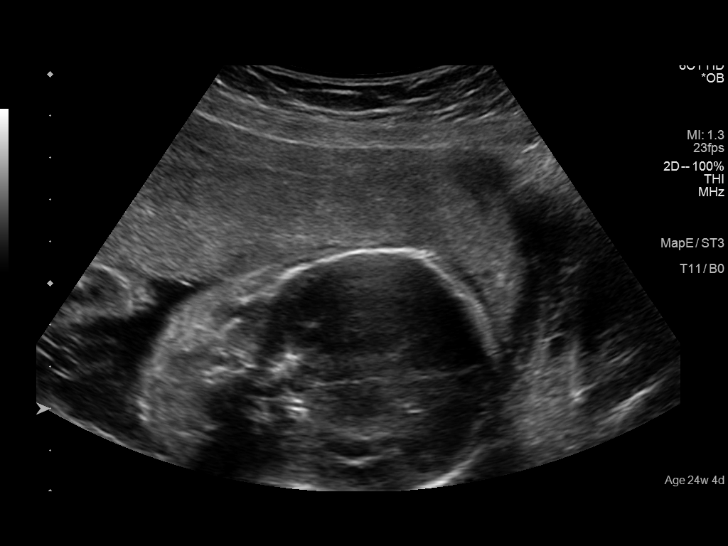
[im 15/77]
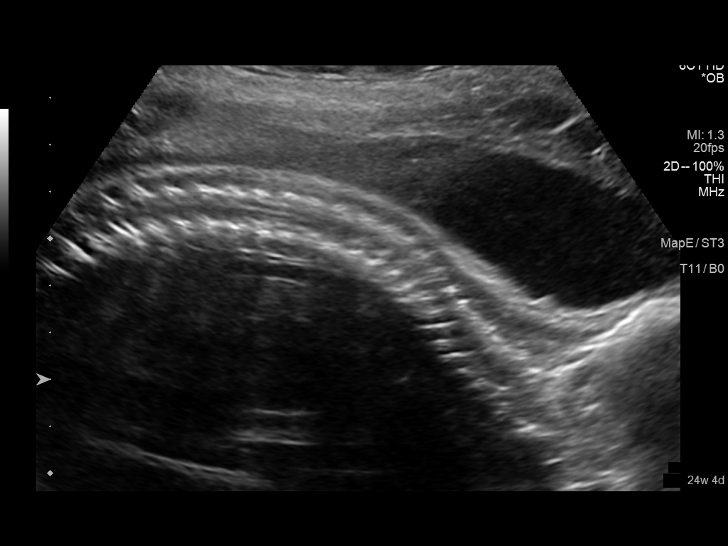
[im 20/77]
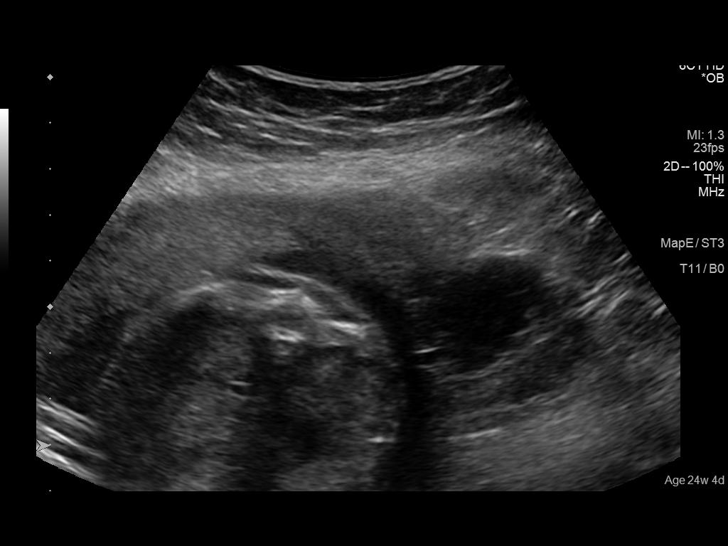
[im 26/77]
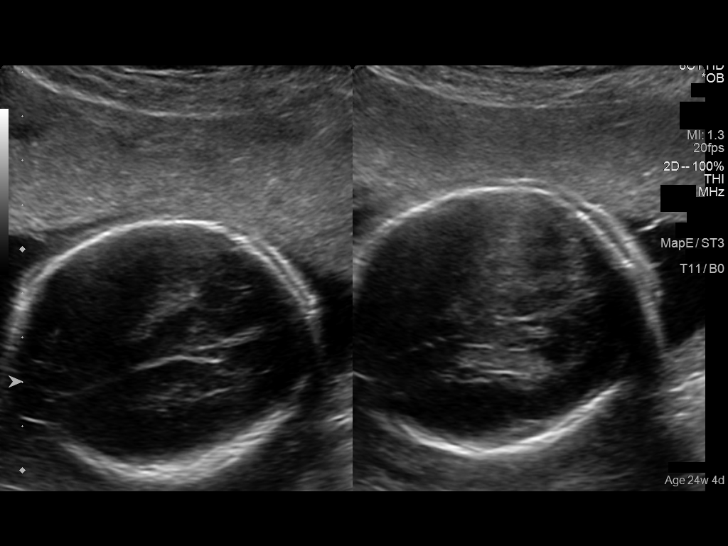
[im 31/77]
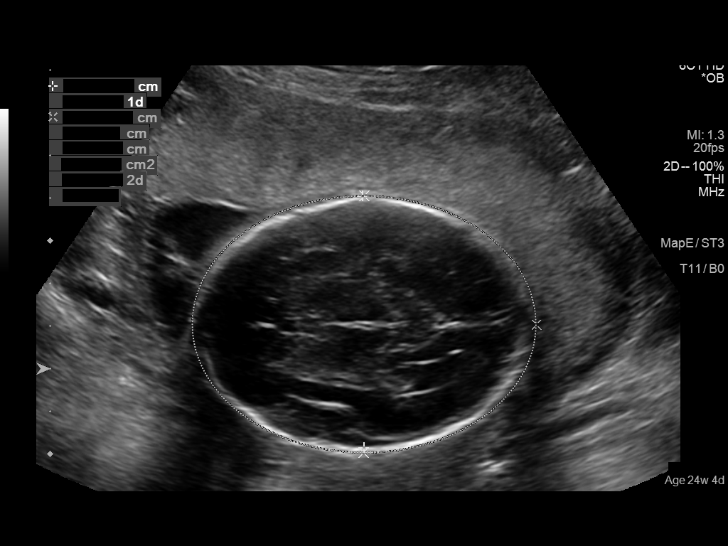
[im 40/77]
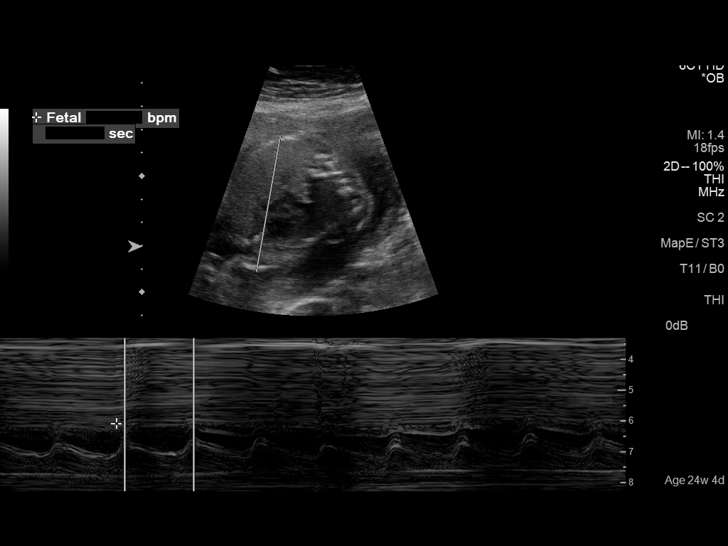
[im 46/77]
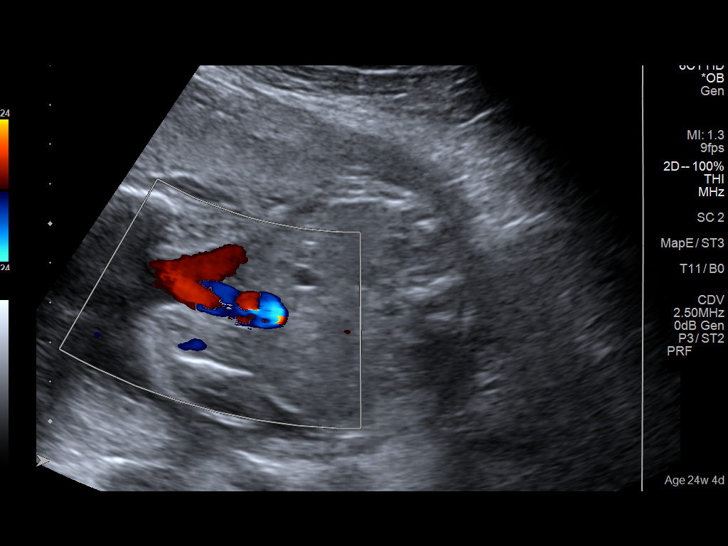
[im 51/77]
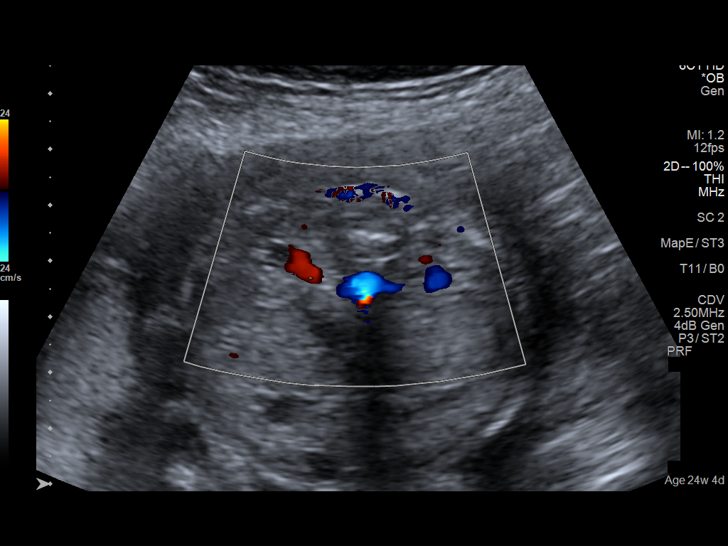
[im 57/77]
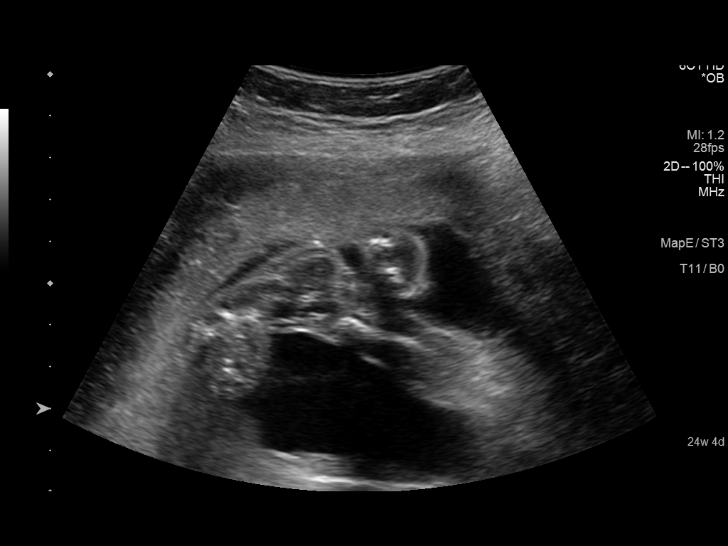
[im 62/77]
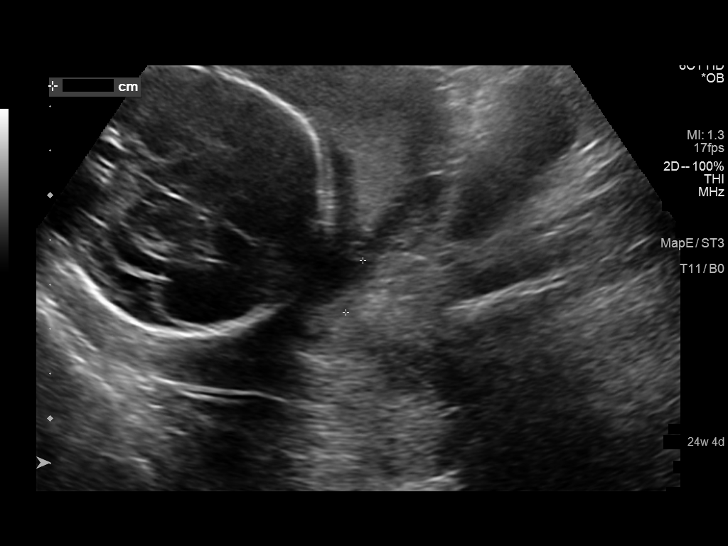
[im 68/77]
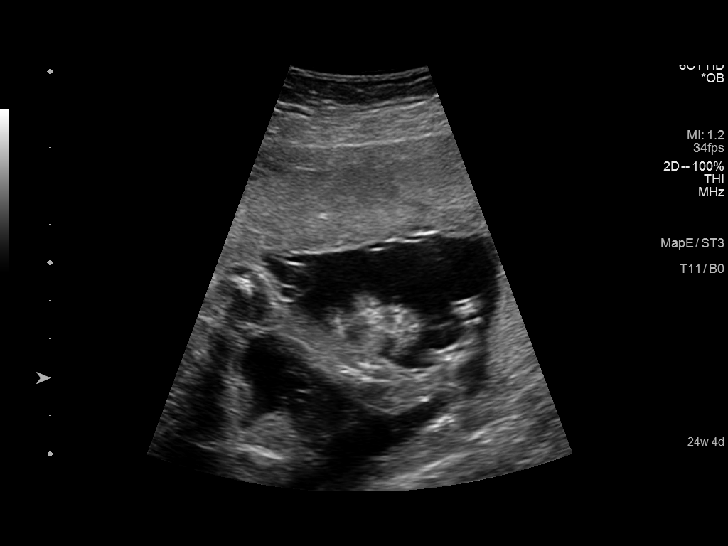
[im 74/77]
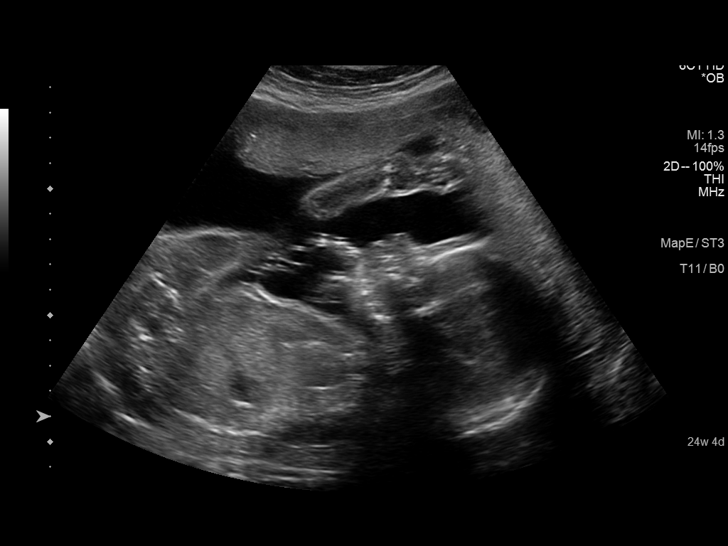

[13 of 28 positions shown; findings below may reference images not displayed]

FINDINGS: Number of Fetuses: 1

Heart Rate:  149 bpm

Movement: Yes

Presentation: Cephalic

Previa: No placenta previa. Low lying placenta 1.2 cm from the
cervical loss.

Placental Location: Anterior

Amniotic Fluid (Subjective): Normal

Amniotic Fluid (Objective):

FETAL BIOMETRY

BPD: 5.9cm 24w 1d

HC:   22.3cm 24w 2d

AC:   20cm 24w 4d

FL:   4.6cm 25w 1d

Current Mean GA: 24w 4d US EDC: 05/02/2019

Assigned GA:  24w 4d            Assigned EDC: 05/01/2021

FETAL ANATOMY

Lateral Ventricles: Appears normal

Thalami/CSP: Appears normal

Posterior Fossa:  Appears normal

Nuchal Region: Appears normal   NFT= N/A > 20 WKS

Upper Lip: Appears normal

Spine: Appears normal

4 Chamber Heart on Left: Appears normal

LVOT: Appears normal

RVOT: Appears normal

Stomach on Left: Appears normal

3 Vessel Cord: Appears normal

Cord Insertion site: Appears normal

Kidneys: Appears normal.  Right renal pelvis measures 5 mm.

Bladder: Appears normal

Extremities: Appears normal

Technically difficult due to: None

Maternal Findings:

Cervix:  3.1 cm in length.  Closed.
IMPRESSION: 1. Single live intrauterine pregnancy as detailed above.
2. Mild fetal right pelviectasis measuring 5 mm. Recommend follow-up
ultrasound in 4-6 weeks for re-evaluation.
3. No other focal fetal anatomic abnormality.
4. Low-lying placenta 1.2 cm from the cervical os.

## 2023-03-07 IMAGING — US US OB FOLLOW-UP
1 series · 13 of 28 positions shown · non-contrast
Comparison: none

CLINICAL DATA: Follow-up low lying placenta, fetal pelviectasis,
growth, and fluid.

EXAM:
OBSTETRIC 14+ WK ULTRASOUND FOLLOW-UP

[Series 1: us ob follow up · 13 of 49 slices shown]
[im 2/49]
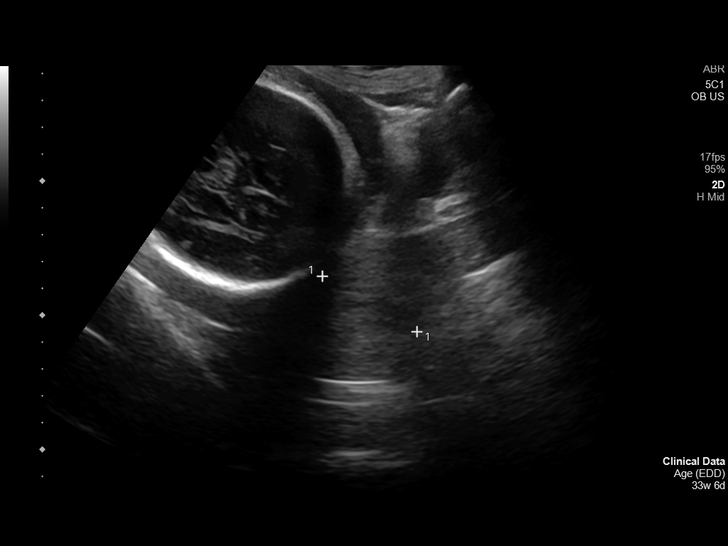
[im 6/49]
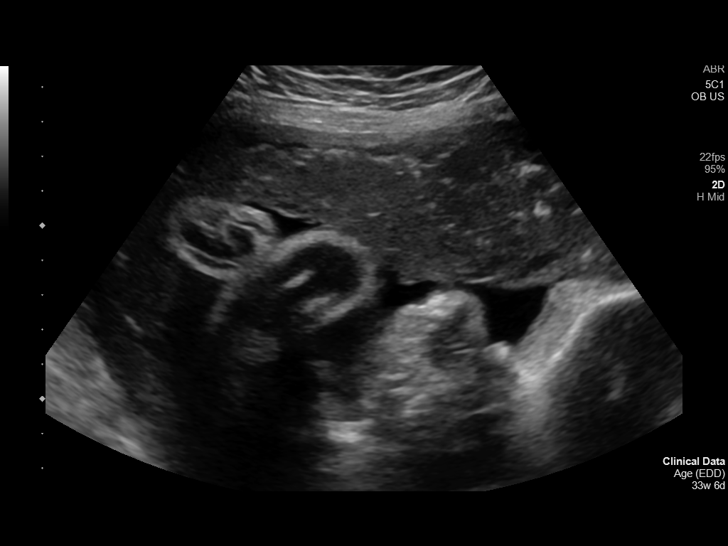
[im 9/49]
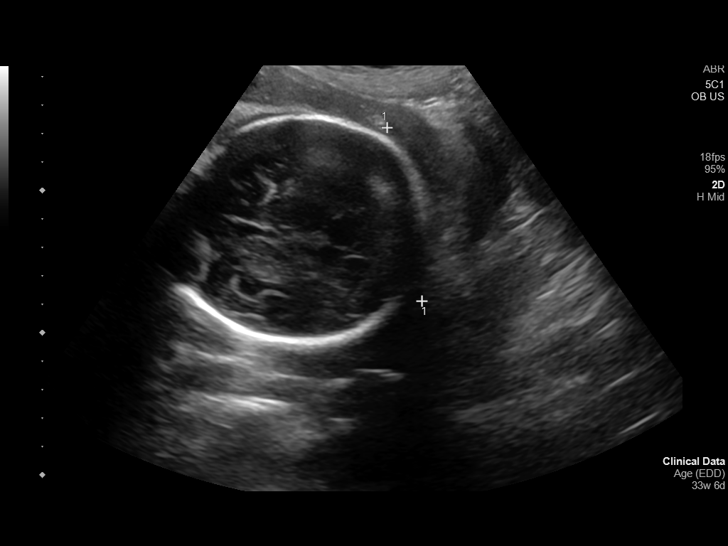
[im 13/49]
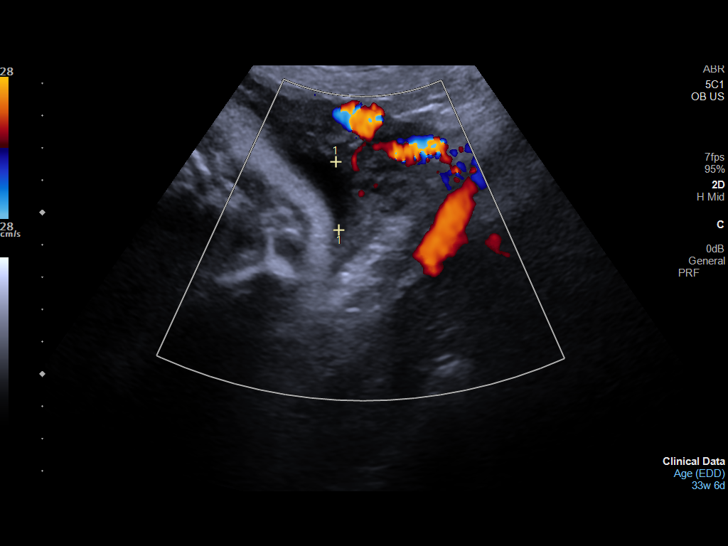
[im 17/49]
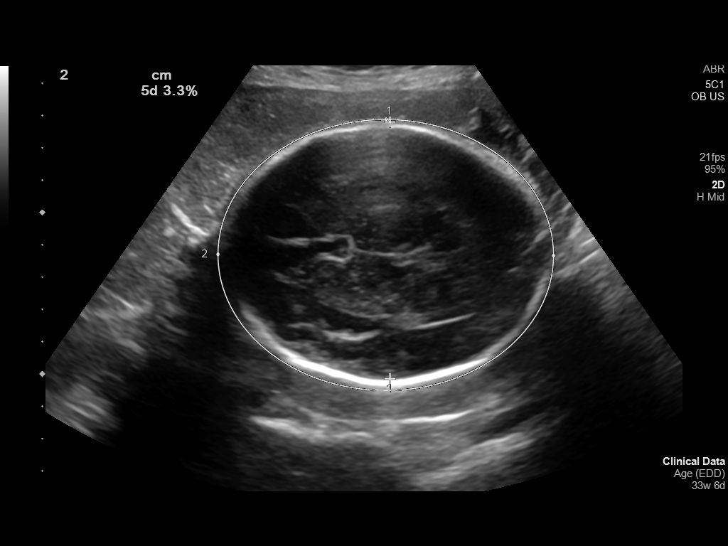
[im 20/49]
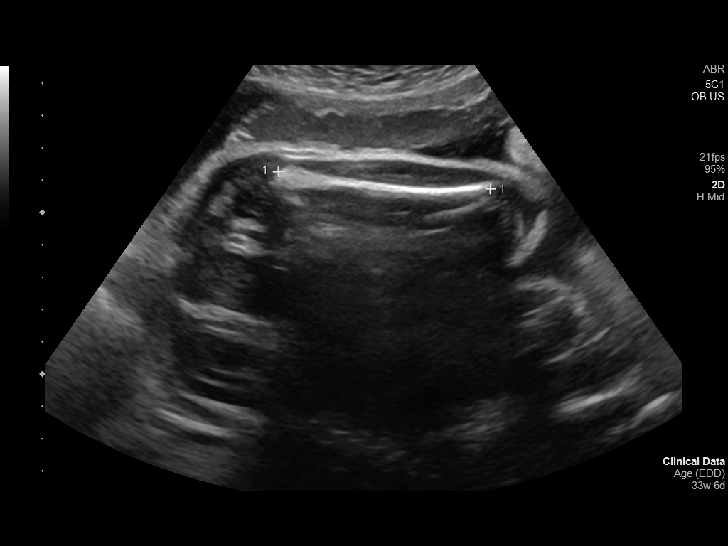
[im 25/49]
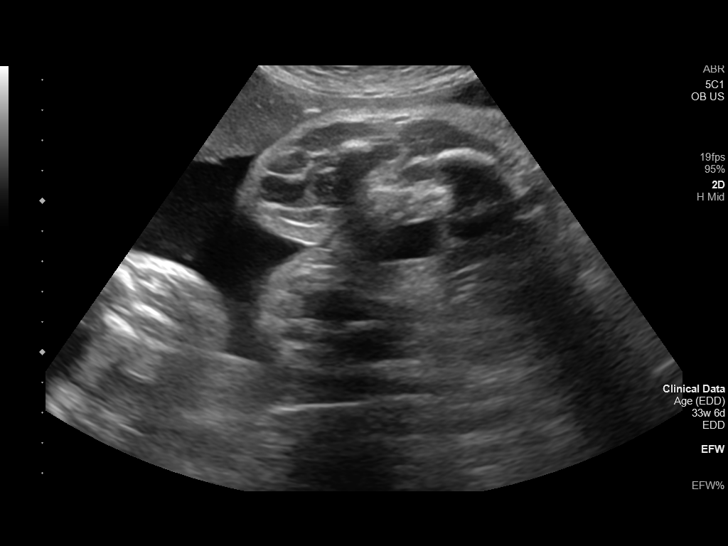
[im 29/49]
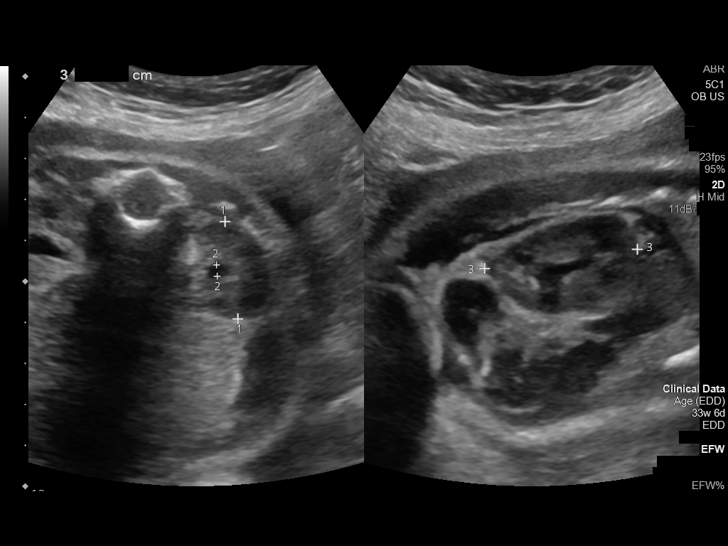
[im 33/49]
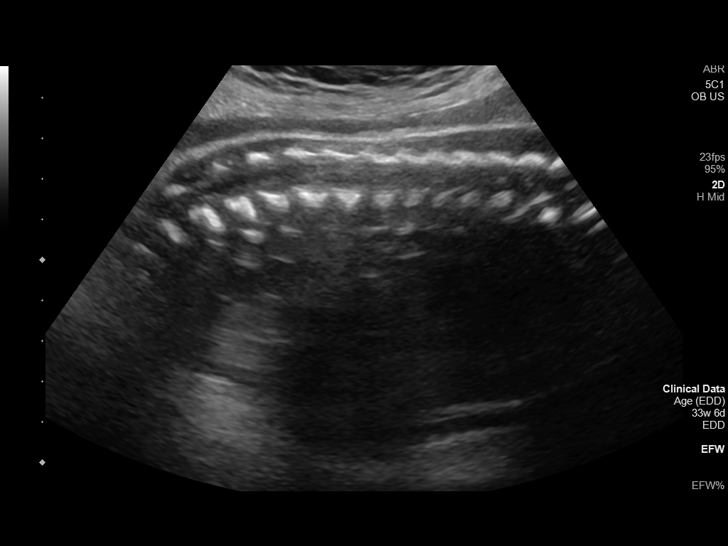
[im 36/49]
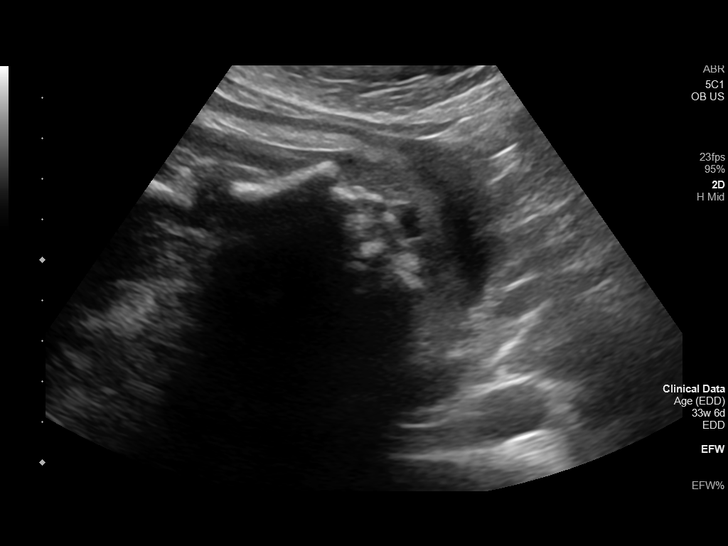
[im 40/49]
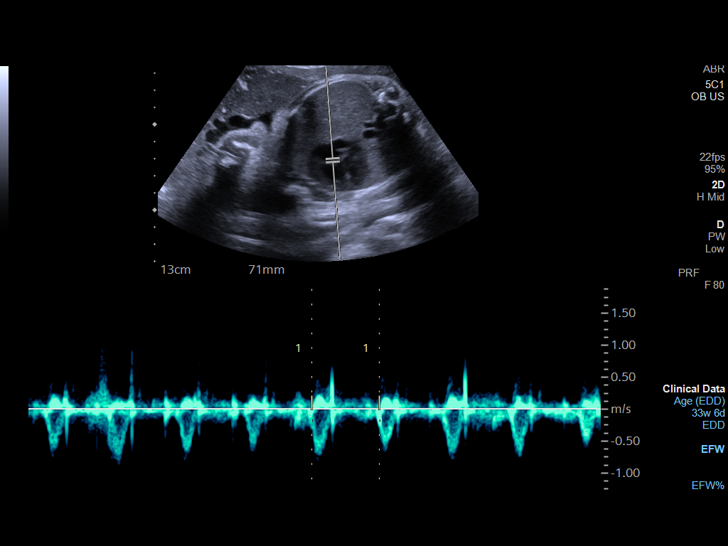
[im 43/49]
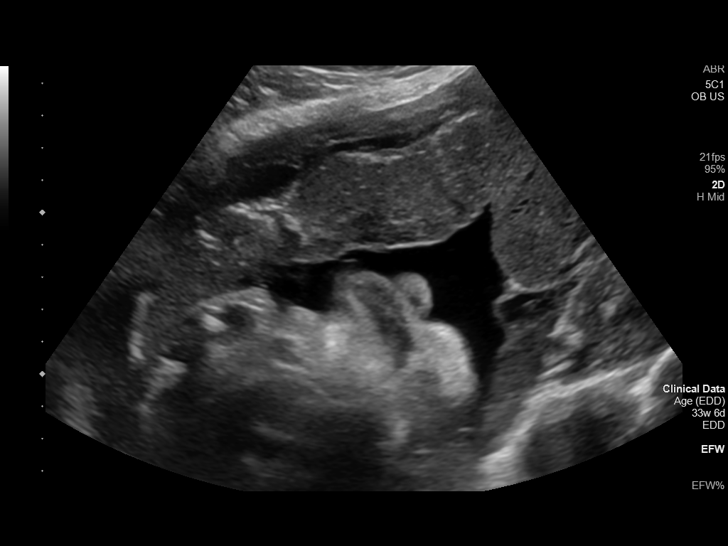
[im 47/49]
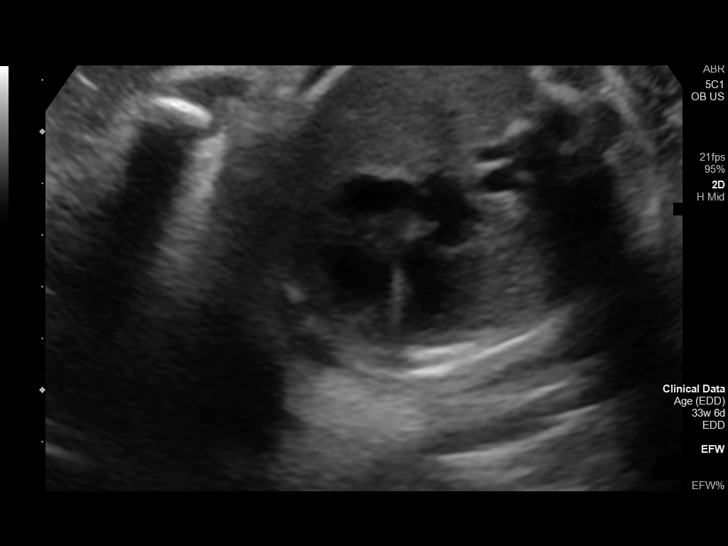

[13 of 28 positions shown; findings below may reference images not displayed]

FINDINGS: Number of Fetuses: 1

Heart Rate:  152 bpm

Movement: Present

Presentation: Cephalic

Previa: None

Placental Location: Anterior

Amniotic Fluid (Subjective): Normal

Amniotic Fluid (Objective):

AFI 10.7 cm (5%ile= 8.3 cm, 95%= 24.5 cm for 33 wks)

FETAL BIOMETRY

BPD:  7.90cm 31w 5d

HC:    29.24cm 32w 2d

AC:    29.41cm 33w 3d

FL:    6.61cm 34w 0d

Current Mean GA: 32w 5d US EDC: 05/09/2021

Assigned GA: 33w 6d Assigned EDC: 05/01/2021

Estimated Fetal Weight:  2163.7g 27%ile

FETAL ANATOMY

Lateral Ventricles: Appears normal

Thalami/CSP: Appears normal

Posterior Fossa: Previously seen

Nuchal Region: Previously seen

Upper Lip: Previously seen

Spine: Appears normal

4 Chamber Heart on Left: Appears normal

LVOT: Previously seen

RVOT: Appears normal

Stomach on Left: Appears normal

3 Vessel Cord: Previously seen

Cord Insertion site: Previously seen

Kidneys: Appears normal

Bladder: Appears normal

Extremities: Previously seen

Sex: Not Visualized

Technical Limitations: None

Maternal Findings:

Cervix:  4.1 centimeters on transabdominal evaluation
IMPRESSION: 1. Single living intrauterine fetus in cephalic presentation.
2. Amniotic fluid volume is within normal limits.
3. Size and dates correlate within a days.
4. Normal appearance of both kidneys and bladder.
